# Patient Record
Sex: Female | Born: 1985 | Race: Black or African American | Hispanic: No | Marital: Single | Smoking: Former smoker
Health system: Southern US, Community
[De-identification: ages and names within clinical notes are randomized; demographics above are authoritative.]

## PROBLEM LIST (undated history)

## (undated) ENCOUNTER — Inpatient Hospital Stay (HOSPITAL_COMMUNITY): Payer: Self-pay

## (undated) DIAGNOSIS — D649 Anemia, unspecified: Secondary | ICD-10-CM

---

## 2018-03-31 ENCOUNTER — Encounter (HOSPITAL_COMMUNITY): Payer: Self-pay | Admitting: Emergency Medicine

## 2018-03-31 ENCOUNTER — Other Ambulatory Visit: Payer: Self-pay

## 2018-03-31 ENCOUNTER — Emergency Department (HOSPITAL_COMMUNITY): Payer: Self-pay

## 2018-03-31 ENCOUNTER — Emergency Department (HOSPITAL_COMMUNITY)
Admission: EM | Admit: 2018-03-31 | Discharge: 2018-04-01 | Disposition: A | Discharge status: Home / Self Care | Payer: Self-pay | Attending: Emergency Medicine | Admitting: Emergency Medicine

## 2018-03-31 DIAGNOSIS — O26899 Other specified pregnancy related conditions, unspecified trimester: Secondary | ICD-10-CM

## 2018-03-31 DIAGNOSIS — R112 Nausea with vomiting, unspecified: Secondary | ICD-10-CM

## 2018-03-31 DIAGNOSIS — Z87891 Personal history of nicotine dependence: Secondary | ICD-10-CM | POA: Insufficient documentation

## 2018-03-31 DIAGNOSIS — Z3A01 Less than 8 weeks gestation of pregnancy: Secondary | ICD-10-CM

## 2018-03-31 DIAGNOSIS — O9989 Other specified diseases and conditions complicating pregnancy, childbirth and the puerperium: Secondary | ICD-10-CM | POA: Insufficient documentation

## 2018-03-31 DIAGNOSIS — R197 Diarrhea, unspecified: Secondary | ICD-10-CM

## 2018-03-31 DIAGNOSIS — R102 Pelvic and perineal pain: Secondary | ICD-10-CM | POA: Insufficient documentation

## 2018-03-31 DIAGNOSIS — O23591 Infection of other part of genital tract in pregnancy, first trimester: Secondary | ICD-10-CM | POA: Insufficient documentation

## 2018-03-31 DIAGNOSIS — O210 Mild hyperemesis gravidarum: Secondary | ICD-10-CM | POA: Insufficient documentation

## 2018-03-31 LAB — COMPREHENSIVE METABOLIC PANEL
ALT: 13 U/L — ABNORMAL LOW (ref 14–54)
ANION GAP: 10 (ref 5–15)
AST: 18 U/L (ref 15–41)
Albumin: 3.8 g/dL (ref 3.5–5.0)
Alkaline Phosphatase: 72 U/L (ref 38–126)
BILIRUBIN TOTAL: 0.3 mg/dL (ref 0.3–1.2)
BUN: 9 mg/dL (ref 6–20)
CHLORIDE: 106 mmol/L (ref 101–111)
CO2: 24 mmol/L (ref 22–32)
Calcium: 8.7 mg/dL — ABNORMAL LOW (ref 8.9–10.3)
Creatinine, Ser: 0.59 mg/dL (ref 0.44–1.00)
Glucose, Bld: 111 mg/dL — ABNORMAL HIGH (ref 65–99)
POTASSIUM: 3.8 mmol/L (ref 3.5–5.1)
Sodium: 140 mmol/L (ref 135–145)
TOTAL PROTEIN: 7.3 g/dL (ref 6.5–8.1)

## 2018-03-31 LAB — LIPASE, BLOOD: LIPASE: 25 U/L (ref 11–51)

## 2018-03-31 LAB — CBC
HEMATOCRIT: 35.5 % — AB (ref 36.0–46.0)
HEMOGLOBIN: 11.4 g/dL — AB (ref 12.0–15.0)
MCH: 26.4 pg (ref 26.0–34.0)
MCHC: 32.1 g/dL (ref 30.0–36.0)
MCV: 82.2 fL (ref 78.0–100.0)
Platelets: 338 10*3/uL (ref 150–400)
RBC: 4.32 MIL/uL (ref 3.87–5.11)
RDW: 14.4 % (ref 11.5–15.5)
WBC: 9.3 10*3/uL (ref 4.0–10.5)

## 2018-03-31 LAB — I-STAT BETA HCG BLOOD, ED (MC, WL, AP ONLY): I-stat hCG, quantitative: >2000 m[IU]/mL — ABNORMAL HIGH (ref <5)

## 2018-03-31 NOTE — ED Notes (Signed)
Bed: WA06 Expected date:  Expected time:  Means of arrival:  Comments: 

## 2018-03-31 NOTE — ED Provider Notes (Signed)
[facility]-EMERGENCY DEPT Provider Note   CSN: [id number] Arrival date & time: 03/31/18  1914     History   Chief Complaint Chief Complaint  Patient presents with  . Abdominal Pain  . Nausea  . Emesis  . Diarrhea    HPI Denise Clay is a 32 y.o. female.  Patient presents with complaint of abdominal pain in RLQ that intermittently crosses the lower abdomen. She has had associated nausea, vomiting, and diarrhea and symptoms started about 4 days ago. No fever, CP, urinary symptoms, vaginal discharge or vaginal bleeding. No sick contacts. The vomiting occurs any time she tries to eat anything.   The history is provided by the patient. No language interpreter was used.    History reviewed. No pertinent past medical history.  There are no active problems to display for this patient.   History reviewed. No pertinent surgical history.   OB History   None      Home Medications    Prior to Admission medications   Not on File    Family History No family history on file.  Social History Social History   Tobacco Use  . Smoking status: Former Smoker    Last attempt to quit: 12/11/2017    Years since quitting: 0.3  . Smokeless tobacco: Never Used  Substance Use Topics  . Alcohol use: Not Currently    Frequency: Never  . Drug use: Never     Allergies   Morphine and related   Review of Systems Review of Systems  Constitutional: Negative for chills and fever.  Respiratory: Negative.  Negative for cough and shortness of breath.   Cardiovascular: Negative.  Negative for chest pain.  Gastrointestinal: Positive for abdominal pain, diarrhea, nausea and vomiting.  Genitourinary: Negative for dysuria, vaginal bleeding and vaginal discharge.  Musculoskeletal: Negative.  Negative for back pain.  Skin: Negative.   Neurological: Negative.      Physical Exam Updated Vital Signs BP 114/69 (BP Location: Left Arm)   Pulse 72   Temp 97.8 F  (36.6 C) (Oral)   Resp 18   LMP 02/24/2018 (Exact Date)   SpO2 100%   Physical Exam  Constitutional: She appears well-developed and well-nourished.  HENT:  Head: Normocephalic.  Neck: Normal range of motion. Neck supple.  Cardiovascular: Normal rate and regular rhythm.  Pulmonary/Chest: Effort normal and breath sounds normal.  Abdominal: Soft. There is tenderness in the right lower quadrant and suprapubic area. There is no rebound and no guarding.  Genitourinary: Uterus is not tender. Cervix exhibits no motion tenderness, no discharge and no friability. Right adnexum displays no tenderness. Left adnexum displays no tenderness. No tenderness or bleeding in the vagina. Vaginal discharge (White, consistent, smooth discharge) found.  Musculoskeletal: Normal range of motion.  Neurological: She is alert. No cranial nerve deficit.  Skin: Skin is warm and dry. No rash noted.  Psychiatric: She has a normal mood and affect.     ED Treatments / Results  Labs (all labs ordered are listed, but only abnormal results are displayed) Labs Reviewed  COMPREHENSIVE METABOLIC PANEL - Abnormal; Notable for the following components:      Result Value   Glucose, Bld 111 (*)    Calcium 8.7 (*)    ALT 13 (*)    All other components within normal limits  CBC - Abnormal; Notable for the following components:   Hemoglobin 11.4 (*)    HCT 35.5 (*)    All other components within normal limits  I-STAT BETA HCG BLOOD, ED ([facility], [facility], [facility] ONLY) - Abnormal; Notable for the following components:   I-stat hCG, quantitative >2,000.0 (*)    All other components within normal limits  WET PREP, GENITAL  LIPASE, BLOOD  URINALYSIS, ROUTINE W REFLEX MICROSCOPIC  HCG, QUANTITATIVE, PREGNANCY  HIV ANTIBODY (ROUTINE TESTING)  RAPID HIV SCREEN (HIV 1/2 AB+AG)  ABO/RH  GC/CHLAMYDIA PROBE AMP ([place]) NOT AT [facility]   Results for orders placed or performed during the hospital encounter of 03/31/18  Lipase, blood    Result Value Ref Range   Lipase 25 11 - 51 U/L  Comprehensive metabolic panel  Result Value Ref Range   Sodium 140 135 - 145 mmol/L   Potassium 3.8 3.5 - 5.1 mmol/L   Chloride 106 101 - 111 mmol/L   CO2 24 22 - 32 mmol/L   Glucose, Bld 111 (H) 65 - 99 mg/dL   BUN 9 6 - 20 mg/dL   Creatinine, Ser 1.61 0.44 - 1.00 mg/dL   Calcium 8.7 (L) 8.9 - 10.3 mg/dL   Total Protein 7.3 6.5 - 8.1 g/dL   Albumin 3.8 3.5 - 5.0 g/dL   AST 18 15 - 41 U/L   ALT 13 (L) 14 - 54 U/L   Alkaline Phosphatase 72 38 - 126 U/L   Total Bilirubin 0.3 0.3 - 1.2 mg/dL   GFR calc non Af Amer >60 >60 mL/min   GFR calc Af Amer >60 >60 mL/min   Anion gap 10 5 - 15  CBC  Result Value Ref Range   WBC 9.3 4.0 - 10.5 K/uL   RBC 4.32 3.87 - 5.11 MIL/uL   Hemoglobin 11.4 (L) 12.0 - 15.0 g/dL   HCT 09.6 (L) 04.5 - 40.9 %   MCV 82.2 78.0 - 100.0 fL   MCH 26.4 26.0 - 34.0 pg   MCHC 32.1 30.0 - 36.0 g/dL   RDW 81.1 91.4 - 78.2 %   Platelets 338 150 - 400 K/uL  I-Stat beta hCG blood, ED  Result Value Ref Range   I-stat hCG, quantitative >2,000.0 (H) <5 mIU/mL   Comment 3            EKG None  Radiology No results found.  Procedures Procedures (including critical care time)  Medications Ordered in ED Medications - No data to display   Initial Impression / Assessment and Plan / ED Course  I have reviewed the triage vital signs and the nursing notes.  Pertinent labs & imaging results that were available during my care of the patient were reviewed by me and considered in my medical decision making (see chart for details).     Patient with RLQ and suprapubic abdominal pain x 4 days associated with N, V, D.  Labs are stable. She is pregnant. Pelvic exam does not show significant or concerning tenderness, bleeding or discharge. US obtained which verifies single IUP without visualized embryo. Recommend follow up US in 10-14 days.   Findings discussed with the patient. She can be discharged home with OB/GYN  follow up. Flagyl provided for BV infection detected on wet prep.   Final Clinical Impressions(s) / ED Diagnoses   Final diagnoses:  Pelvic pain during pregnancy   2. . Pregnant 3. BV  ED Discharge Orders    None       Danne Harbor 04/01/18 0119    Benjiman Core, MD 04/03/18 [phone]

## 2018-03-31 NOTE — ED Triage Notes (Signed)
Pt states that she has been having NVD since Sunday.  RLQ pain.  Pt appears to be in no distress.

## 2018-03-31 NOTE — ED Notes (Signed)
Bed: WA08 Expected date:  Expected time:  Means of arrival:  Comments: 

## 2018-03-31 NOTE — ED Notes (Signed)
Ultrasound at bedside

## 2018-04-01 LAB — URINALYSIS, ROUTINE W REFLEX MICROSCOPIC
BILIRUBIN URINE: NEGATIVE
Bacteria, UA: NONE SEEN
GLUCOSE, UA: NEGATIVE mg/dL
Hgb urine dipstick: NEGATIVE
KETONES UR: NEGATIVE mg/dL
NITRITE: NEGATIVE
PH: 6 (ref 5.0–8.0)
Protein, ur: 30 mg/dL — AB
SPECIFIC GRAVITY, URINE: 1.033 — AB (ref 1.005–1.030)

## 2018-04-01 LAB — RAPID HIV SCREEN (HIV 1/2 AB+AG)
HIV 1/2 ANTIBODIES: NONREACTIVE
HIV-1 P24 ANTIGEN - HIV24: NONREACTIVE

## 2018-04-01 LAB — WET PREP, GENITAL
Sperm: NONE SEEN
TRICH WET PREP: NONE SEEN
WBC, Wet Prep HPF POC: NONE SEEN
Yeast Wet Prep HPF POC: NONE SEEN

## 2018-04-01 LAB — GC/CHLAMYDIA PROBE AMP (~~LOC~~) NOT AT ARMC
Chlamydia: NEGATIVE
Neisseria Gonorrhea: NEGATIVE

## 2018-04-01 LAB — HIV ANTIBODY (ROUTINE TESTING W REFLEX): HIV SCREEN 4TH GENERATION: NONREACTIVE

## 2018-04-01 LAB — ABO/RH: ABO/RH(D): B POS

## 2018-04-01 LAB — HCG, QUANTITATIVE, PREGNANCY: hCG, Beta Chain, Quant, S: 29164 m[IU]/mL — ABNORMAL HIGH (ref <5)

## 2018-04-01 MED ORDER — PRENATAL VITAMINS 0.8 MG PO TABS: 1.0000 | ORAL_TABLET | Freq: Every day | ORAL | 0 refills | Status: AC

## 2018-04-01 MED ORDER — METRONIDAZOLE 500 MG PO TABS: 500.0000 mg | ORAL_TABLET | Freq: Two times a day (BID) | ORAL | 0 refills | Status: DC

## 2018-04-01 MED ORDER — ONDANSETRON 4 MG PO TBDP: 4.0000 mg | ORAL_TABLET | Freq: Three times a day (TID) | ORAL | 0 refills | Status: DC | PRN

## 2018-04-01 NOTE — Discharge Instructions (Signed)
Your ultrasound will need to be repeated to confirm the baby's good health. Call to make an appointment with OB/GYN for further evaluation and prenatal care. If you have vaginal bleeding, worsening pelvic pain or new symptoms concerning the pregnancy go to [facility] MAU for urgent care. Return here as needed.

## 2018-04-12 ENCOUNTER — Inpatient Hospital Stay (HOSPITAL_COMMUNITY)
Admission: AD | Admit: 2018-04-12 | Discharge: 2018-04-13 | Disposition: A | Discharge status: Home / Self Care | Payer: Self-pay | Source: Ambulatory Visit | Attending: Obstetrics and Gynecology | Admitting: Obstetrics and Gynecology

## 2018-04-12 ENCOUNTER — Encounter (HOSPITAL_COMMUNITY): Payer: Self-pay | Admitting: *Deleted

## 2018-04-12 DIAGNOSIS — R109 Unspecified abdominal pain: Secondary | ICD-10-CM

## 2018-04-12 DIAGNOSIS — Z3A08 8 weeks gestation of pregnancy: Secondary | ICD-10-CM | POA: Insufficient documentation

## 2018-04-12 DIAGNOSIS — Z87891 Personal history of nicotine dependence: Secondary | ICD-10-CM | POA: Insufficient documentation

## 2018-04-12 DIAGNOSIS — Z3491 Encounter for supervision of normal pregnancy, unspecified, first trimester: Secondary | ICD-10-CM

## 2018-04-12 DIAGNOSIS — R1031 Right lower quadrant pain: Secondary | ICD-10-CM | POA: Insufficient documentation

## 2018-04-12 DIAGNOSIS — E860 Dehydration: Secondary | ICD-10-CM | POA: Insufficient documentation

## 2018-04-12 DIAGNOSIS — O219 Vomiting of pregnancy, unspecified: Secondary | ICD-10-CM | POA: Insufficient documentation

## 2018-04-12 DIAGNOSIS — Z885 Allergy status to narcotic agent status: Secondary | ICD-10-CM | POA: Insufficient documentation

## 2018-04-12 DIAGNOSIS — O99281 Endocrine, nutritional and metabolic diseases complicating pregnancy, first trimester: Secondary | ICD-10-CM | POA: Insufficient documentation

## 2018-04-12 DIAGNOSIS — O26891 Other specified pregnancy related conditions, first trimester: Secondary | ICD-10-CM | POA: Insufficient documentation

## 2018-04-12 DIAGNOSIS — O26899 Other specified pregnancy related conditions, unspecified trimester: Secondary | ICD-10-CM

## 2018-04-12 LAB — URINALYSIS, ROUTINE W REFLEX MICROSCOPIC
Bilirubin Urine: NEGATIVE
Glucose, UA: NEGATIVE mg/dL
Hgb urine dipstick: NEGATIVE
Ketones, ur: 80 mg/dL — AB
Leukocytes, UA: NEGATIVE
Nitrite: NEGATIVE
PROTEIN: 100 mg/dL — AB
SPECIFIC GRAVITY, URINE: 1.029 (ref 1.005–1.030)
pH: 7 (ref 5.0–8.0)

## 2018-04-12 MED ORDER — PROMETHAZINE HCL 25 MG/ML IJ SOLN
25.0000 mg | Freq: Once | INTRAMUSCULAR | Status: AC
  Administered 2018-04-13: 25 mg via INTRAVENOUS
  Filled 2018-04-12: qty 1

## 2018-04-12 MED ORDER — DEXTROSE 5 % IN LACTATED RINGERS IV BOLUS
1000.0000 mL | Freq: Once | INTRAVENOUS | Status: AC
  Administered 2018-04-12: 1000 mL via INTRAVENOUS

## 2018-04-12 NOTE — MAU Note (Addendum)
PT SAYS  SHE WENT  TO [facility]- 2 WEEKS AGO- FOR ABD PAIN AND NAUSEA-  POSITIVE UPT- TOLD  TO COME HERE.      VOMITING  X4 DAYS   PT DID NOT  DELIVERED OTHER BABIES  HERE.   HAS LOWER ABD  PAIN- STARTED SAT .

## 2018-04-12 NOTE — MAU Provider Note (Signed)
Chief Complaint: Emesis   First Provider Initiated Contact with Patient 04/12/18 2301      SUBJECTIVE HPI: Denise Clay is a 32 y.o. G3P2002 at [redacted]w[redacted]d by LMP who presents to maternity admissions reporting RLQ pain x 2 weeks and nausea with vomiting. She reports she vomits 10+ times daily.  She was diagnosed with early IUP on 5/22 at [facility] and prescribed Zofran for nausea. She reports it does not help. She last took it this morning. Her pain is low on her right side, sharp, and constant pain.  There are no other associated symptoms. She has not tried any other treatments.     HPI  Past Medical History:  Diagnosis Date  . Medical history non-contributory    Past Surgical History:  Procedure Laterality Date  . CESAREAN SECTION     Social History   Socioeconomic History  . Marital status: Single    Spouse name: Not on file  . Number of children: Not on file  . Years of education: Not on file  . Highest education level: Not on file  Occupational History  . Not on file  Social Needs  . Financial resource strain: Not on file  . Food insecurity:    Worry: Not on file    Inability: Not on file  . Transportation needs:    Medical: Not on file    Non-medical: Not on file  Tobacco Use  . Smoking status: Former Smoker    Last attempt to quit: 12/11/2017    Years since quitting: 0.3  . Smokeless tobacco: Never Used  Substance and Sexual Activity  . Alcohol use: Not Currently    Frequency: Never  . Drug use: Never  . Sexual activity: Yes  Lifestyle  . Physical activity:    Days per week: Not on file    Minutes per session: Not on file  . Stress: Not on file  Relationships  . Social connections:    Talks on phone: Not on file    Gets together: Not on file    Attends religious service: Not on file    Active member of club or organization: Not on file    Attends meetings of clubs or organizations: Not on file    Relationship status: Not on file  . Intimate partner violence:   Fear of current or ex partner: Not on file    Emotionally abused: Not on file    Physically abused: Not on file    Forced sexual activity: Not on file  Other Topics Concern  . Not on file  Social History Narrative  . Not on file   No current facility-administered medications on file prior to encounter.    Current Outpatient Medications on File Prior to Encounter  Medication Sig Dispense Refill  . ondansetron (ZOFRAN ODT) 4 MG disintegrating tablet Take 1 tablet (4 mg total) by mouth every 8 (eight) hours as needed for nausea or vomiting. 20 tablet 0  . Prenatal Multivit-Min-Fe-FA (PRENATAL VITAMINS) 0.8 MG tablet Take 1 tablet by mouth daily. 60 tablet 0   Allergies  Allergen Reactions  . Morphine And Related Itching and Swelling    ROS:  Review of Systems  Constitutional: Negative for chills, fatigue and fever.  Respiratory: Negative for shortness of breath.   Cardiovascular: Negative for chest pain.  Genitourinary: Positive for pelvic pain. Negative for difficulty urinating, dysuria, flank pain, vaginal bleeding, vaginal discharge and vaginal pain.  Neurological: Negative for dizziness and headaches.  Psychiatric/Behavioral: Negative.  I have reviewed patient's Past Medical Hx, Surgical Hx, Family Hx, Social Hx, medications and allergies.   Physical Exam   Patient Vitals for the past 24 hrs:  BP Temp Temp src Pulse Resp Height Weight  04/13/18 0159 122/70 98.3 F (36.8 C) Oral 74 18 - -  04/12/18 2220 117/60 98.6 F (37 C) Oral 81 20 5\' 4"  (1.626 m) 277 lb 4 oz (125.8 kg)   Constitutional: Well-developed, well-nourished female in no acute distress.  Cardiovascular: normal rate Respiratory: normal effort GI: Abd soft, non-tender. No rebound tenderness or guarding. Pos BS x 4 MS: Extremities nontender, no edema, normal ROM Neurologic: Alert and oriented x 4.  GU: Neg CVAT.  PELVIC EXAM: Cervix pink, visually closed, without lesion, scant white creamy discharge,  vaginal walls and external genitalia normal Bimanual exam: Cervix 0/long/high, firm, anterior, neg CMT, uterus mildly tender, nonenlarged, adnexa with mild tenderness on the right, no enlargement or mass bilaterally    LAB RESULTS Results for orders placed or performed during the hospital encounter of 04/12/18 (from the past 24 hour(s))  Urinalysis, Routine w reflex microscopic     Status: Abnormal   Collection Time: 04/12/18 10:22 PM  Result Value Ref Range   Color, Urine YELLOW YELLOW   APPearance HAZY (A) CLEAR   Specific Gravity, Urine 1.029 1.005 - 1.030   pH 7.0 5.0 - 8.0   Glucose, UA NEGATIVE NEGATIVE mg/dL   Hgb urine dipstick NEGATIVE NEGATIVE   Bilirubin Urine NEGATIVE NEGATIVE   Ketones, ur 80 (A) NEGATIVE mg/dL   Protein, ur 161 (A) NEGATIVE mg/dL   Nitrite NEGATIVE NEGATIVE   Leukocytes, UA NEGATIVE NEGATIVE   RBC / HPF 6-10 0 - 5 RBC/hpf   WBC, UA 0-5 0 - 5 WBC/hpf   Bacteria, UA RARE (A) NONE SEEN   Squamous Epithelial / LPF 11-20 0 - 5   Mucus PRESENT   CBC     Status: Abnormal   Collection Time: 04/12/18 11:55 PM  Result Value Ref Range   WBC 11.0 (H) 4.0 - 10.5 K/uL   RBC 4.23 3.87 - 5.11 MIL/uL   Hemoglobin 11.3 (L) 12.0 - 15.0 g/dL   HCT 09.6 (L) 04.5 - 40.9 %   MCV 83.5 78.0 - 100.0 fL   MCH 26.7 26.0 - 34.0 pg   MCHC 32.0 30.0 - 36.0 g/dL   RDW 81.1 91.4 - 78.2 %   Platelets 331 150 - 400 K/uL  Comprehensive metabolic panel     Status: Abnormal   Collection Time: 04/12/18 11:55 PM  Result Value Ref Range   Sodium 133 (L) 135 - 145 mmol/L   Potassium 4.1 3.5 - 5.1 mmol/L   Chloride 104 101 - 111 mmol/L   CO2 17 (L) 22 - 32 mmol/L   Glucose, Bld 92 65 - 99 mg/dL   BUN 7 6 - 20 mg/dL   Creatinine, Ser 9.56 0.44 - 1.00 mg/dL   Calcium 8.7 (L) 8.9 - 10.3 mg/dL   Total Protein 6.9 6.5 - 8.1 g/dL   Albumin 3.6 3.5 - 5.0 g/dL   AST 14 (L) 15 - 41 U/L   ALT 10 (L) 14 - 54 U/L   Alkaline Phosphatase 69 38 - 126 U/L   Total Bilirubin 0.8 0.3 - 1.2  mg/dL   GFR calc non Af Amer >60 >60 mL/min   GFR calc Af Amer >60 >60 mL/min   Anion gap 12 5 - 15    --/--/B POS (05/23 0014)  IMAGING [place] Ob Transvaginal  Result Date: 04/13/2018 CLINICAL DATA:  Initial evaluation for acute lower abdominal pain, early pregnancy. EXAM: OBSTETRIC <14 WK US AND TRANSVAGINAL OB US TECHNIQUE: Both transabdominal and transvaginal ultrasound examinations were performed for complete evaluation of the gestation as well as the maternal uterus, adnexal regions, and pelvic cul-de-sac. Transvaginal technique was performed to assess early pregnancy. COMPARISON:  None. FINDINGS: Intrauterine gestational sac: Single Yolk sac:  Present Embryo:  Present Cardiac Activity: Present Heart Rate: 154 bpm CRL: 16.7 mm   80 w   0 d                  US EDC: 11/23/2018 Subchorionic hemorrhage:  None visualized. Maternal uterus/adnexae: Ovaries are normal in appearance bilaterally. Small corpus luteal cyst noted on the left. No free fluid. IMPRESSION: 1. Single live intrauterine pregnancy as above without complication, estimated gestational age [redacted] weeks and 0 days by crown-rump length. 2. No other acute maternal uterine or adnexal abnormality identified. Electronically Signed   By: Rise MuBenjamin  McClintock M.D.   On: 04/13/2018 00:40   [place] Ob Less Than 14 Weeks W/ Ob Transvaginal And Doppler  Result Date: 04/01/2018 CLINICAL DATA:  Pelvic pain with vomiting, i-STAT HCG greater than 2000 EXAM: OBSTETRIC <14 WK US AND TRANSVAGINAL OB US DOPPLER ULTRASOUND OF OVARIES TECHNIQUE: Both transabdominal and transvaginal ultrasound examinations were performed for complete evaluation of the gestation as well as the maternal uterus, adnexal regions, and pelvic cul-de-sac. Transvaginal technique was performed to assess early pregnancy. Color and duplex Doppler ultrasound was utilized to evaluate blood flow to the ovaries. COMPARISON:  None. FINDINGS: Intrauterine gestational sac: Single intrauterine gestational  sac. Yolk sac:  Visible Embryo:  Not visible MSD: 13.3 mm   6 w   1 d Subchorionic hemorrhage:  None visualized. Maternal uterus/adnexae: Left ovary is within normal limits and measures 2.7 by 3.5 x 2.1 cm. Cyst measuring 1.9 cm. The right ovary measures 3.3 x 2.2 by 2 cm. Echogenic lesion in the right ovary measuring 1 cm, possible hemorrhagic follicle. Trace free fluid. Pulsed Doppler evaluation of both ovaries demonstrates normal appearing low-resistance arterial and venous waveforms. IMPRESSION: 1. Negative for ovarian torsion 2. Single intrauterine pregnancy with visible gestational sac and yolk sac but no embryo. Consider follow-up ultrasound in 10-14 days to confirm viability. 3. Trace free fluid in the pelvis Electronically Signed   By: Jasmine PangKim  Fujinaga M.D.   On: 04/01/2018 00:40    MAU Management/MDM: CBC, CMP, UA, OB US CBC and CMP without significant abnormalities UA with 80 ketones, and high specific gravity so evidence of dehydration IV fluids, Phenergan 25 mg IV given with significant improvement  Rx for Phenergan, pt to take PRN with Zofran sparingly for breakthrough symptoms F/U with Ob/gyn provider, list provided Return to MAU as needed for emergencies  Pt discharged with strict return precautions.  ASSESSMENT 1. Nausea and vomiting during pregnancy prior to [redacted] weeks gestation   2. Abdominal pain during pregnancy in first trimester   3. Mild dehydration   4. Abdominal pain affecting pregnancy   5. Normal IUP (intrauterine pregnancy) on prenatal ultrasound, first trimester     PLAN Discharge home Allergies as of 04/13/2018      Reactions   Morphine And Related Itching, Swelling      Medication List    STOP taking these medications   metroNIDAZOLE 500 MG tablet Commonly known as:  FLAGYL     TAKE these medications   ondansetron 4 MG  disintegrating tablet Commonly known as:  ZOFRAN ODT Take 1 tablet (4 mg total) by mouth every 8 (eight) hours as needed for nausea or  vomiting.   Prenatal Vitamins 0.8 MG tablet Take 1 tablet by mouth daily.   promethazine 25 MG tablet Commonly known as:  PHENERGAN Take 0.5-1 tablets (12.5-25 mg total) by mouth every 6 (six) hours as needed for nausea.      Follow-up Information    Prenatal provider of your choice Follow up.   Why:  See list provided          Sharen Counter Certified Nurse-Midwife 04/13/2018  4:56 AM

## 2018-04-13 ENCOUNTER — Inpatient Hospital Stay (HOSPITAL_COMMUNITY): Payer: Self-pay

## 2018-04-13 DIAGNOSIS — R109 Unspecified abdominal pain: Secondary | ICD-10-CM

## 2018-04-13 DIAGNOSIS — O26891 Other specified pregnancy related conditions, first trimester: Secondary | ICD-10-CM

## 2018-04-13 DIAGNOSIS — O219 Vomiting of pregnancy, unspecified: Secondary | ICD-10-CM

## 2018-04-13 LAB — CBC
HEMATOCRIT: 35.3 % — AB (ref 36.0–46.0)
Hemoglobin: 11.3 g/dL — ABNORMAL LOW (ref 12.0–15.0)
MCH: 26.7 pg (ref 26.0–34.0)
MCHC: 32 g/dL (ref 30.0–36.0)
MCV: 83.5 fL (ref 78.0–100.0)
PLATELETS: 331 10*3/uL (ref 150–400)
RBC: 4.23 MIL/uL (ref 3.87–5.11)
RDW: 14.4 % (ref 11.5–15.5)
WBC: 11 10*3/uL — AB (ref 4.0–10.5)

## 2018-04-13 LAB — COMPREHENSIVE METABOLIC PANEL
ALBUMIN: 3.6 g/dL (ref 3.5–5.0)
ALT: 10 U/L — ABNORMAL LOW (ref 14–54)
ANION GAP: 12 (ref 5–15)
AST: 14 U/L — AB (ref 15–41)
Alkaline Phosphatase: 69 U/L (ref 38–126)
BILIRUBIN TOTAL: 0.8 mg/dL (ref 0.3–1.2)
BUN: 7 mg/dL (ref 6–20)
CHLORIDE: 104 mmol/L (ref 101–111)
CO2: 17 mmol/L — AB (ref 22–32)
Calcium: 8.7 mg/dL — ABNORMAL LOW (ref 8.9–10.3)
Creatinine, Ser: 0.53 mg/dL (ref 0.44–1.00)
GFR calc Af Amer: >60 mL/min (ref >60)
GFR calc non Af Amer: >60 mL/min (ref >60)
GLUCOSE: 92 mg/dL (ref 65–99)
POTASSIUM: 4.1 mmol/L (ref 3.5–5.1)
SODIUM: 133 mmol/L — AB (ref 135–145)
Total Protein: 6.9 g/dL (ref 6.5–8.1)

## 2018-04-13 MED ORDER — PROMETHAZINE HCL 25 MG PO TABS: 12.5000 mg | ORAL_TABLET | Freq: Four times a day (QID) | ORAL | 2 refills | Status: DC | PRN

## 2018-04-13 NOTE — Discharge Instructions (Signed)
[place] [facility]  ° ° °[facility]       Phone: [phone] ° °[facility] [place] Phone: [phone] ° °[facility] [place]  Phone: [phone] ° °[facility] [place]  Phone: [phone] ° °[facility] [place]  Phone: [phone] ° °[facility]       Phone: [phone] ° °[facility]    Phone: [phone]  ° °[facility] ([place])    Phone: [phone] ° °[facility]    Phone: [phone] ° °[facility]    Phone: [phone] ° °[facility]    Phone: [phone] ° °[facility]       Phone: [phone]  ° °[facility]  Phone: [phone] ° °[facility]    Phone: [phone] ° °[facility]    Phone: [phone] ° °[facility]      Phone: [phone] ° °[facility]    Phone: [phone] ° °

## 2018-05-05 ENCOUNTER — Inpatient Hospital Stay (HOSPITAL_COMMUNITY)
Admission: AD | Admit: 2018-05-05 | Discharge: 2018-05-05 | Disposition: A | Discharge status: Home / Self Care | Payer: Medicaid Other | Source: Ambulatory Visit | Attending: Obstetrics & Gynecology | Admitting: Obstetrics & Gynecology

## 2018-05-05 ENCOUNTER — Encounter (HOSPITAL_COMMUNITY): Payer: Self-pay | Admitting: *Deleted

## 2018-05-05 DIAGNOSIS — Z87891 Personal history of nicotine dependence: Secondary | ICD-10-CM | POA: Insufficient documentation

## 2018-05-05 DIAGNOSIS — Z3A11 11 weeks gestation of pregnancy: Secondary | ICD-10-CM | POA: Insufficient documentation

## 2018-05-05 DIAGNOSIS — Z885 Allergy status to narcotic agent status: Secondary | ICD-10-CM | POA: Insufficient documentation

## 2018-05-05 DIAGNOSIS — O209 Hemorrhage in early pregnancy, unspecified: Secondary | ICD-10-CM

## 2018-05-05 DIAGNOSIS — O210 Mild hyperemesis gravidarum: Secondary | ICD-10-CM | POA: Insufficient documentation

## 2018-05-05 DIAGNOSIS — O219 Vomiting of pregnancy, unspecified: Secondary | ICD-10-CM

## 2018-05-05 LAB — URINALYSIS, ROUTINE W REFLEX MICROSCOPIC
BILIRUBIN URINE: NEGATIVE
Glucose, UA: NEGATIVE mg/dL
Hgb urine dipstick: NEGATIVE
KETONES UR: 80 mg/dL — AB
LEUKOCYTES UA: NEGATIVE
Nitrite: NEGATIVE
Protein, ur: 100 mg/dL — AB
Specific Gravity, Urine: 1.028 (ref 1.005–1.030)
pH: 6 (ref 5.0–8.0)

## 2018-05-05 MED ORDER — ONDANSETRON 4 MG PO TBDP: 4.0000 mg | ORAL_TABLET | Freq: Four times a day (QID) | ORAL | 0 refills | Status: DC | PRN

## 2018-05-05 NOTE — Discharge Instructions (Signed)
Morning Sickness Morning sickness is when you feel sick to your stomach (nauseous) during pregnancy. This nauseous feeling may or may not come with vomiting. It often occurs in the morning but can be a problem any time of day. Morning sickness is most common during the first trimester, but it may continue throughout pregnancy. While morning sickness is unpleasant, it is usually harmless unless you develop severe and continual vomiting (hyperemesis gravidarum). This condition requires more intense treatment. What are the causes? The cause of morning sickness is not completely known but seems to be related to normal hormonal changes that occur in pregnancy. What increases the risk? You are at greater risk if you:  Experienced nausea or vomiting before your pregnancy.  Had morning sickness during a previous pregnancy.  Are pregnant with more than one baby, such as twins.  How is this treated? Do not use any medicines (prescription, over-the-counter, or herbal) for morning sickness without first talking to your health care provider. Your health care provider may prescribe or recommend:  Vitamin B6 supplements.  Anti-nausea medicines.  The herbal medicine ginger.  Follow these instructions at home:  Only take over-the-counter or prescription medicines as directed by your health care provider.  Taking multivitamins before getting pregnant can prevent or decrease the severity of morning sickness in most women.  Eat a piece of dry toast or unsalted crackers before getting out of bed in the morning.  Eat five or six small meals a day.  Eat dry and bland foods (rice, baked potato). Foods high in carbohydrates are often helpful.  Do not drink liquids with your meals. Drink liquids between meals.  Avoid greasy, fatty, and spicy foods.  Get someone to cook for you if the smell of any food causes nausea and vomiting.  If you feel nauseous after taking prenatal vitamins, take the vitamins at  night or with a snack.  Snack on protein foods (nuts, yogurt, cheese) between meals if you are hungry.  Eat unsweetened gelatins for desserts.  Wearing an acupressure wristband (worn for sea sickness) may be helpful.  Acupuncture may be helpful.  Do not smoke.  Get a humidifier to keep the air in your house free of odors.  Get plenty of fresh air. Contact a health care provider if:  Your home remedies are not working, and you need medicine.  You feel dizzy or lightheaded.  You are losing weight. Get help right away if:  You have persistent and uncontrolled nausea and vomiting.  You pass out (faint). This information is not intended to replace advice given to you by your health care provider. Make sure you discuss any questions you have with your health care provider. Document Released: 12/18/2006 Document Revised: 04/03/2016 Document Reviewed: 04/13/2013 Elsevier Interactive Patient Education  2017 Elsevier Inc.  Vaginal Bleeding During Pregnancy, First Trimester A small amount of bleeding (spotting) from the vagina is common in early pregnancy. Sometimes the bleeding is normal and is not a problem, and sometimes it is a sign of something serious. Be sure to tell your doctor about any bleeding from your vagina right away. Follow these instructions at home:  Watch your condition for any changes.  Follow your doctor's instructions about how active you can be.  If you are on bed rest: ? You may need to stay in bed and only get up to use the bathroom. ? You may be allowed to do some activities. ? If you need help, make plans for someone to help you.  Write down: ? The number of pads you use each day. ? How often you change pads. ? How soaked (saturated) your pads are.  Do not use tampons.  Do not douche.  Do not have sex or orgasms until your doctor says it is okay.  If you pass any tissue from your vagina, save the tissue so you can show it to your doctor.  Only  take medicines as told by your doctor.  Do not take aspirin because it can make you bleed.  Keep all follow-up visits as told by your doctor. Contact a doctor if:  You bleed from your vagina.  You have cramps.  You have labor pains.  You have a fever that does not go away after you take medicine. Get help right away if:  You have very bad cramps in your back or belly (abdomen).  You pass large clots or tissue from your vagina.  You bleed more.  You feel light-headed or weak.  You pass out (faint).  You have chills.  You are leaking fluid or have a gush of fluid from your vagina.  You pass out while pooping (having a bowel movement). This information is not intended to replace advice given to you by your health care provider. Make sure you discuss any questions you have with your health care provider. Document Released: 03/13/2014 Document Revised: 04/03/2016 Document Reviewed: 07/04/2013 Elsevier Interactive Patient Education  2018 Elsevier Inc. [place] [facility] Providers    [facility] [employer] [facility]       Phone: [phone]  [facility] [employer] [facility] [place] Phone: [phone]  [facility] [employer] [facility] [place]  Phone: [phone]  [facility]  Phone: [phone]  [facility] [place]  Phone: [phone]  [facility]       Phone: [phone]  [facility]    Phone: [phone]   [facility] [place])    Phone: [phone]  [facility]    Phone: [phone]  [facility]    Phone: [phone]  [facility]    Phone: [phone]  [facility]       Phone: [phone]   [facility]  Phone: [phone]  [facility]    Phone:  [phone]  [facility]   Phone: [phone]  [facility]      Phone: [phone]  [facility]    Phone: [phone]

## 2018-05-05 NOTE — MAU Note (Signed)
Pt presents to MAU with complaints of vaginal bleeding when she wipes since last night with abdominal cramping. Morning sickness continues and states she is taking phenergan for the nausea but states it does not help anymore. Last intercourse Saturday

## 2018-05-05 NOTE — MAU Note (Signed)
Pt reports reports bleeding since last night. Bleeding was bright red at first, but turned to pink/dark brow. No active bleeding @ this time. Pt reports cramping and shooting pain since yesterday. Pain was worse this morning 9/10. Currently pain is on the R side and pt rates it 6/10. Denies discharge or dysuria.   Pt also reports nausea/vomiting/spitting. Pt took phenergan Monday, but came back up. Has not taken it since.

## 2018-05-05 NOTE — MAU Provider Note (Signed)
Chief Complaint: Vaginal Bleeding; Morning Sickness; and Abdominal Pain   First Provider Initiated Contact with Patient 05/05/18 2037        SUBJECTIVE HPI: Denise Clay is a 32 y.o. G3P2002 at [redacted]w[redacted]d by LMP who presents to maternity admissions reporting bleeding for the past couple of days.  States was bright red and halfway soaked a pad "like a period".  Less now.  ALso has cramping.  Nausea only partially relieved by Phenergan.. She denies vaginal itching/burning, urinary symptoms, h/a, dizziness, or fever/chills.    Vaginal Bleeding  The patient's primary symptoms include pelvic pain and vaginal bleeding. The patient's pertinent negatives include no genital itching, genital lesions or genital odor. This is a recurrent problem. The current episode started in the past 7 days. The problem occurs intermittently. The problem has been gradually improving. She is pregnant. Associated symptoms include abdominal pain, nausea and vomiting. Pertinent negatives include no chills, constipation, diarrhea or fever. The vaginal discharge was bloody. The vaginal bleeding is typical of menses. She has not been passing clots. She has not been passing tissue. Nothing aggravates the symptoms. She has tried nothing for the symptoms.  Abdominal Pain  This is a recurrent problem. The current episode started in the past 7 days. The onset quality is gradual. The problem occurs intermittently. The problem has been waxing and waning. The pain is located in the suprapubic region, LLQ and RLQ. The quality of the pain is cramping. The abdominal pain does not radiate. Associated symptoms include nausea and vomiting. Pertinent negatives include no constipation, diarrhea or fever. The pain is relieved by nothing. She has tried nothing for the symptoms.    Past Medical History:  Diagnosis Date  . Medical history non-contributory    Past Surgical History:  Procedure Laterality Date  . CESAREAN SECTION     Social History    Socioeconomic History  . Marital status: Single    Spouse name: Not on file  . Number of children: Not on file  . Years of education: Not on file  . Highest education level: Not on file  Occupational History  . Not on file  Social Needs  . Financial resource strain: Not on file  . Food insecurity:    Worry: Not on file    Inability: Not on file  . Transportation needs:    Medical: Not on file    Non-medical: Not on file  Tobacco Use  . Smoking status: Former Smoker    Last attempt to quit: 12/11/2017    Years since quitting: 0.3  . Smokeless tobacco: Never Used  Substance and Sexual Activity  . Alcohol use: Not Currently    Frequency: Never  . Drug use: Never  . Sexual activity: Yes  Lifestyle  . Physical activity:    Days per week: Not on file    Minutes per session: Not on file  . Stress: Not on file  Relationships  . Social connections:    Talks on phone: Not on file    Gets together: Not on file    Attends religious service: Not on file    Active member of club or organization: Not on file    Attends meetings of clubs or organizations: Not on file    Relationship status: Not on file  . Intimate partner violence:    Fear of current or ex partner: Not on file    Emotionally abused: Not on file    Physically abused: Not on file    Forced  sexual activity: Not on file  Other Topics Concern  . Not on file  Social History Narrative  . Not on file   No current facility-administered medications on file prior to encounter.    Current Outpatient Medications on File Prior to Encounter  Medication Sig Dispense Refill  . ondansetron (ZOFRAN ODT) 4 MG disintegrating tablet Take 1 tablet (4 mg total) by mouth every 8 (eight) hours as needed for nausea or vomiting. 20 tablet 0  . Prenatal Multivit-Min-Fe-FA (PRENATAL VITAMINS) 0.8 MG tablet Take 1 tablet by mouth daily. 60 tablet 0  . promethazine (PHENERGAN) 25 MG tablet Take 0.5-1 tablets (12.5-25 mg total) by mouth  every 6 (six) hours as needed for nausea. 30 tablet 2   Allergies  Allergen Reactions  . Morphine And Related Itching and Swelling    I have reviewed patient's Past Medical Hx, Surgical Hx, Family Hx, Social Hx, medications and allergies.   ROS:  Review of Systems  Constitutional: Negative for chills and fever.  Gastrointestinal: Positive for abdominal pain, nausea and vomiting. Negative for constipation and diarrhea.  Genitourinary: Positive for pelvic pain and vaginal bleeding.   Review of Systems  Other systems negative   Physical Exam  Physical Exam Patient Vitals for the past 24 hrs:  BP Temp Pulse Resp Height Weight  05/05/18 1842 131/77 98.2 F (36.8 C) 85 16 5\' 4"  (1.626 m) 267 lb (121.1 kg)   Constitutional: Well-developed, well-nourished female in no acute distress.  Cardiovascular: normal rate Respiratory: normal effort GI: Abd soft, non-tender. Pos BS x 4 MS: Extremities nontender, no edema, normal ROM Neurologic: Alert and oriented x 4.  GU: Neg CVAT.  PELVIC EXAM: Cervix pink, visually closed, without lesion, scant pink creamy discharge, vaginal walls and external genitalia normal  FHT 160 by bedside US.  Fetus appears grossly normal, steady HR  LAB RESULTS Results for orders placed or performed during the hospital encounter of 05/05/18 (from the past 24 hour(s))  Urinalysis, Routine w reflex microscopic     Status: Abnormal   Collection Time: 05/05/18  6:54 PM  Result Value Ref Range   Color, Urine AMBER (A) YELLOW   APPearance HAZY (A) CLEAR   Specific Gravity, Urine 1.028 1.005 - 1.030   pH 6.0 5.0 - 8.0   Glucose, UA NEGATIVE NEGATIVE mg/dL   Hgb urine dipstick NEGATIVE NEGATIVE   Bilirubin Urine NEGATIVE NEGATIVE   Ketones, ur 80 (A) NEGATIVE mg/dL   Protein, ur 161100 (A) NEGATIVE mg/dL   Nitrite NEGATIVE NEGATIVE   Leukocytes, UA NEGATIVE NEGATIVE   RBC / HPF 0-5 0 - 5 RBC/hpf   WBC, UA 6-10 0 - 5 WBC/hpf   Bacteria, UA RARE (A) NONE SEEN    Squamous Epithelial / LPF 0-5 0 - 5   Mucus PRESENT     --/--/B POS (05/23 0014)  IMAGING   MAU Management/MDM: Reassured patient baby is doing well Likely the remainder of prior [place] hemorrhage Will try adding some Zofran for judicious use. Warned of small chance of birth defects, use only if needed PO hydration for cramps, Tylenol prn  ASSESSMENT Single IUP at [id number] Bleeding in first trimester Nausea and vomiting  PLAN Discharge home Rx Zofan for PRN use for nausea Bleeding precautions Start prenatal care Pt stable at time of discharge. Encouraged to return here or to other Urgent Care/ED if she develops worsening of symptoms, increase in pain, fever, or other concerning symptoms.    Wynelle BourgeoisMarie Natsumi Whitsitt CNM, MSN Certified Nurse-Midwife 05/05/2018  8:37 PM

## 2018-08-10 ENCOUNTER — Encounter: Payer: Self-pay | Admitting: *Deleted

## 2018-08-10 ENCOUNTER — Ambulatory Visit (INDEPENDENT_AMBULATORY_CARE_PROVIDER_SITE_OTHER): Payer: Self-pay | Admitting: Advanced Practice Midwife

## 2018-08-10 ENCOUNTER — Encounter: Payer: Self-pay | Admitting: Advanced Practice Midwife

## 2018-08-10 VITALS — BP 120/64 | HR 95 | Wt 271.7 lb

## 2018-08-10 DIAGNOSIS — Z3403 Encounter for supervision of normal first pregnancy, third trimester: Secondary | ICD-10-CM

## 2018-08-10 DIAGNOSIS — O26899 Other specified pregnancy related conditions, unspecified trimester: Secondary | ICD-10-CM

## 2018-08-10 DIAGNOSIS — Z3490 Encounter for supervision of normal pregnancy, unspecified, unspecified trimester: Secondary | ICD-10-CM | POA: Insufficient documentation

## 2018-08-10 DIAGNOSIS — Z3482 Encounter for supervision of other normal pregnancy, second trimester: Secondary | ICD-10-CM | POA: Diagnosis not present

## 2018-08-10 DIAGNOSIS — R102 Pelvic and perineal pain: Secondary | ICD-10-CM

## 2018-08-10 DIAGNOSIS — K117 Disturbances of salivary secretion: Secondary | ICD-10-CM

## 2018-08-10 LAB — POCT URINALYSIS DIP (DEVICE)
GLUCOSE, UA: NEGATIVE mg/dL
HGB URINE DIPSTICK: NEGATIVE
Ketones, ur: NEGATIVE mg/dL
LEUKOCYTES UA: NEGATIVE
NITRITE: NEGATIVE
Protein, ur: 30 mg/dL — AB
Specific Gravity, Urine: 1.02 (ref 1.005–1.030)
UROBILINOGEN UA: 1 mg/dL (ref 0.0–1.0)
pH: 7.5 (ref 5.0–8.0)

## 2018-08-10 MED ORDER — ONDANSETRON HCL 4 MG PO TABS: 4.0000 mg | ORAL_TABLET | Freq: Three times a day (TID) | ORAL | 1 refills | Status: DC | PRN

## 2018-08-10 MED ORDER — COMFORT FIT MATERNITY SUPP MED MISC: 1.0000 | Freq: Every day | 0 refills | Status: DC

## 2018-08-10 MED ORDER — GLYCOPYRROLATE 2 MG PO TABS: 2.0000 mg | ORAL_TABLET | Freq: Three times a day (TID) | ORAL | 3 refills | Status: DC | PRN

## 2018-08-10 NOTE — Patient Instructions (Addendum)
 PREGNANCY SUPPORT BELT: You are not alone, Seventy-five percent of women have some sort of abdominal or back pain at some point in their pregnancy. Your baby is growing at a fast pace, which means that your whole body is rapidly trying to adjust to the changes. As your uterus grows, your back may start feeling a bit under stress and this can result in back or abdominal pain that can go from mild, and therefore bearable, to severe pains that will not allow you to sit or lay down comfortably, When it comes to dealing with pregnancy-related pains and cramps, some pregnant women usually prefer natural remedies, which the market is filled with nowadays. For example, wearing a pregnancy support belt can help ease and lessen your discomfort and pain. WHAT ARE THE BENEFITS OF WEARING A PREGNANCY SUPPORT BELT? A pregnancy support belt provides support to the lower portion of the belly taking some of the weight of the growing uterus and distributing to the other parts of your body. It is designed make you comfortable and gives you extra support. Over the years, the pregnancy apparel market has been studying the needs and wants of pregnant women and they have come up with the most comfortable pregnancy support belts that woman could ever ask for. In fact, you will no longer have to wear a stretched-out or bulky pregnancy belt that is visible underneath your clothes and makes you feel even more uncomfortable. Nowadays, a pregnancy support belt is made of comfortable and stretchy materials that will not irritate your skin but will actually make you feel at ease and you will not even notice you are wearing it. They are easy to put on and adjust during the day and can be worn at night for additional support.  BENEFITS: . Relives Back pain . Relieves Abdominal Muscle and Leg Pain . Stabilizes the Pelvic Ring . Offers a Cushioned Abdominal Lift Pad . Relieves pressure on the Sciatic Nerve Within Minutes WHERE TO GET  YOUR PREGNANCY BELT: [employer] [phone] @[address] [place], [place] [address]     Third Trimester of Pregnancy The third trimester is from week 28 through week 40 (months 7 through 9). The third trimester is a time when the unborn baby (fetus) is growing rapidly. At the end of the ninth month, the fetus is about 20 inches in length and weighs 6-10 pounds. Body changes during your third trimester Your body will continue to go through many changes during pregnancy. The changes vary from woman to woman. During the third trimester:  Your weight will continue to increase. You can expect to gain 25-35 pounds (11-16 kg) by the end of the pregnancy.  You may begin to get stretch marks on your hips, abdomen, and breasts.  You may urinate more often because the fetus is moving lower into your pelvis and pressing on your bladder.  You may develop or continue to have heartburn. This is caused by increased hormones that slow down muscles in the digestive tract.  You may develop or continue to have constipation because increased hormones slow digestion and cause the muscles that push waste through your intestines to relax.  You may develop hemorrhoids. These are swollen veins (varicose veins) in the rectum that can itch or be painful.  You may develop swollen, bulging veins (varicose veins) in your legs.  You may have increased body aches in the pelvis, back, or thighs. This is due to weight gain and increased hormones that   are relaxing your joints.  You may have changes in your hair. These can include thickening of your hair, rapid growth, and changes in texture. Some women also have hair loss during or after pregnancy, or hair that feels dry or thin. Your hair will most likely return to normal after your baby is born.  Your breasts will continue to grow and they will continue to become tender. A yellow fluid (colostrum) may leak from your breasts. This is the first milk  you are producing for your baby.  Your belly button may stick out.  You may notice more swelling in your hands, face, or ankles.  You may have increased tingling or numbness in your hands, arms, and legs. The skin on your belly may also feel numb.  You may feel short of breath because of your expanding uterus.  You may have more problems sleeping. This can be caused by the size of your belly, increased need to urinate, and an increase in your body's metabolism.  You may notice the fetus "dropping," or moving lower in your abdomen (lightening).  You may have increased vaginal discharge.  You may notice your joints feel loose and you may have pain around your pelvic bone.  What to expect at prenatal visits You will have prenatal exams every 2 weeks until week 36. Then you will have weekly prenatal exams. During a routine prenatal visit:  You will be weighed to make sure you and the baby are growing normally.  Your blood pressure will be taken.  Your abdomen will be measured to track your baby's growth.  The fetal heartbeat will be listened to.  Any test results from the previous visit will be discussed.  You may have a cervical check near your due date to see if your cervix has softened or thinned (effaced).  You will be tested for Group B streptococcus. This happens between 35 and 37 weeks.  Your health care provider may ask you:  What your birth plan is.  How you are feeling.  If you are feeling the baby move.  If you have had any abnormal symptoms, such as leaking fluid, bleeding, severe headaches, or abdominal cramping.  If you are using any tobacco products, including cigarettes, chewing tobacco, and electronic cigarettes.  If you have any questions.  Other tests or screenings that may be performed during your third trimester include:  Blood tests that check for low iron levels (anemia).  Fetal testing to check the health, activity level, and growth of the  fetus. Testing is done if you have certain medical conditions or if there are problems during the pregnancy.  Nonstress test (NST). This test checks the health of your baby to make sure there are no signs of problems, such as the baby not getting enough oxygen. During this test, a belt is placed around your belly. The baby is made to move, and its heart rate is monitored during movement.  What is false labor? False labor is a condition in which you feel small, irregular tightenings of the muscles in the womb (contractions) that usually go away with rest, changing position, or drinking water. These are called Braxton Hicks contractions. Contractions may last for hours, days, or even weeks before true labor sets in. If contractions come at regular intervals, become more frequent, increase in intensity, or become painful, you should see your health care provider. What are the signs of labor?  Abdominal cramps.  Regular contractions that start at 10 minutes apart and   become stronger and more frequent with time.  Contractions that start on the top of the uterus and spread down to the lower abdomen and back.  Increased pelvic pressure and dull back pain.  A watery or bloody mucus discharge that comes from the vagina.  Leaking of amniotic fluid. This is also known as your "water breaking." It could be a slow trickle or a gush. Let your health care provider know if it has a color or strange odor. If you have any of these signs, call your health care provider right away, even if it is before your due date. Follow these instructions at home: Medicines  Follow your health care provider's instructions regarding medicine use. Specific medicines may be either safe or unsafe to take during pregnancy.  Take a prenatal vitamin that contains at least 600 micrograms (mcg) of folic acid.  If you develop constipation, try taking a stool softener if your health care provider approves. Eating and  drinking  Eat a balanced diet that includes fresh fruits and vegetables, whole grains, good sources of protein such as meat, eggs, or tofu, and low-fat dairy. Your health care provider will help you determine the amount of weight gain that is right for you.  Avoid raw meat and uncooked cheese. These carry germs that can cause birth defects in the baby.  If you have low calcium intake from food, talk to your health care provider about whether you should take a daily calcium supplement.  Eat four or five small meals rather than three large meals a day.  Limit foods that are high in fat and processed sugars, such as fried and sweet foods.  To prevent constipation: ? Drink enough fluid to keep your urine clear or pale yellow. ? Eat foods that are high in fiber, such as fresh fruits and vegetables, whole grains, and beans. Activity  Exercise only as directed by your health care provider. Most women can continue their usual exercise routine during pregnancy. Try to exercise for 30 minutes at least 5 days a week. Stop exercising if you experience uterine contractions.  Avoid heavy lifting.  Do not exercise in extreme heat or humidity, or at high altitudes.  Wear low-heel, comfortable shoes.  Practice good posture.  You may continue to have sex unless your health care provider tells you otherwise. Relieving pain and discomfort  Take frequent breaks and rest with your legs elevated if you have leg cramps or low back pain.  Take warm sitz baths to soothe any pain or discomfort caused by hemorrhoids. Use hemorrhoid cream if your health care provider approves.  Wear a good support bra to prevent discomfort from breast tenderness.  If you develop varicose veins: ? Wear support pantyhose or compression stockings as told by your healthcare provider. ? Elevate your feet for 15 minutes, 3-4 times a day. Prenatal care  Write down your questions. Take them to your prenatal visits.  Keep all  your prenatal visits as told by your health care provider. This is important. Safety  Wear your seat belt at all times when driving.  Make a list of emergency phone numbers, including numbers for family, friends, the hospital, and police and fire departments. General instructions  Avoid cat litter boxes and soil used by cats. These carry germs that can cause birth defects in the baby. If you have a cat, ask someone to clean the litter box for you.  Do not travel far distances unless it is absolutely necessary and only with the   approval of your health care provider.  Do not use hot tubs, steam rooms, or saunas.  Do not drink alcohol.  Do not use any products that contain nicotine or tobacco, such as cigarettes and e-cigarettes. If you need help quitting, ask your health care provider.  Do not use any medicinal herbs or unprescribed drugs. These chemicals affect the formation and growth of the baby.  Do not douche or use tampons or scented sanitary pads.  Do not cross your legs for long periods of time.  To prepare for the arrival of your baby: ? Take prenatal classes to understand, practice, and ask questions about labor and delivery. ? Make a trial run to the hospital. ? Visit the hospital and tour the maternity area. ? Arrange for maternity or paternity leave through employers. ? Arrange for family and friends to take care of pets while you are in the hospital. ? Purchase a rear-facing car seat and make sure you know how to install it in your car. ? Pack your hospital bag. ? Prepare the baby's nursery. Make sure to remove all pillows and stuffed animals from the baby's crib to prevent suffocation.  Visit your dentist if you have not gone during your pregnancy. Use a soft toothbrush to brush your teeth and be gentle when you floss. Contact a health care provider if:  You are unsure if you are in labor or if your water has broken.  You become dizzy.  You have mild pelvic  cramps, pelvic pressure, or nagging pain in your abdominal area.  You have lower back pain.  You have persistent nausea, vomiting, or diarrhea.  You have an unusual or bad smelling vaginal discharge.  You have pain when you urinate. Get help right away if:  Your water breaks before 37 weeks.  You have regular contractions less than 5 minutes apart before 37 weeks.  You have a fever.  You are leaking fluid from your vagina.  You have spotting or bleeding from your vagina.  You have severe abdominal pain or cramping.  You have rapid weight loss or weight gain.  You have shortness of breath with chest pain.  You notice sudden or extreme swelling of your face, hands, ankles, feet, or legs.  Your baby makes fewer than 10 movements in 2 hours.  You have severe headaches that do not go away when you take medicine.  You have vision changes. Summary  The third trimester is from week 28 through week 40, months 7 through 9. The third trimester is a time when the unborn baby (fetus) is growing rapidly.  During the third trimester, your discomfort may increase as you and your baby continue to gain weight. You may have abdominal, leg, and back pain, sleeping problems, and an increased need to urinate.  During the third trimester your breasts will keep growing and they will continue to become tender. A yellow fluid (colostrum) may leak from your breasts. This is the first milk you are producing for your baby.  False labor is a condition in which you feel small, irregular tightenings of the muscles in the womb (contractions) that eventually go away. These are called Braxton Hicks contractions. Contractions may last for hours, days, or even weeks before true labor sets in.  Signs of labor can include: abdominal cramps; regular contractions that start at 10 minutes apart and become stronger and more frequent with time; watery or bloody mucus discharge that comes from the vagina; increased  pelvic pressure and dull   back pain; and leaking of amniotic fluid. This information is not intended to replace advice given to you by your health care provider. Make sure you discuss any questions you have with your health care provider. Document Released: 10/21/2001 Document Revised: 04/03/2016 Document Reviewed: 12/28/2012 Elsevier Interactive Patient Education  2017 [employer]  

## 2018-08-10 NOTE — Progress Notes (Signed)
   PRENATAL VISIT NOTE  Subjective:  Denise Clay is a 32 y.o. G3P2002 at [redacted]w[redacted]d being seen today for ongoing prenatal care.  She is currently monitored for the following issues for this low-risk pregnancy and has Encounter for supervision of normal pregnancy on their problem list.  Patient reports lower abdominal pain with walking, spitting with nausea.  Contractions: Not present. Vag. Bleeding: None.  Movement: Present. Denies leaking of fluid.   The following portions of the patient's history were reviewed and updated as appropriate: allergies, current medications, past family history, past medical history, past social history, past surgical history and problem list. Problem list updated.  Objective:   Vitals:   08/10/18 1354  BP: 120/64  Pulse: 95  Weight: 123.2 kg    Fetal Status: Fetal Heart Rate (bpm): 148   Movement: Present     VS reviewed, nursing note reviewed,  Constitutional: well developed, well nourished, no distress HEENT: normocephalic HEART: normal rate, heart sounds, regular rhythm RESP: normal effort, lung sounds clear and equal bilaterally Breast Exam:  right breast normal without mass, skin or nipple changes or axillary nodes, left breast normal without mass, skin or nipple changes or axillary nodes Abdomen: soft, gravid Neuro: alert and oriented x 3 Skin: warm, dry Psych: affect normal Pelvic exam: Deferred, pt to have pap at future visit or PP  Assessment and Plan:  Pregnancy: G3P2002 at [redacted]w[redacted]d  1. Encounter for supervision of normal pregnancy in third trimester --Anticipatory guidance about next visits/weeks of pregnancy given. GTT at next visit.  --Discussed and offered genetic screening options, including Quad screen/AFP, NIPS testing, and option to decline testing. Benefits/risks/alternatives reviewed. Pt aware that anatomy US is form of genetic screening with lower accuracy in detecting trisomies than blood work.  Pt chooses NIPS for genetic  screening today. - Culture, OB Urine - Cystic fibrosis gene test - Hemoglobinopathy Evaluation - Obstetric Panel, Including HIV - SMN1 COPY NUMBER ANALYSIS (SMA Carrier Screen) - [place] MFM OB COMP + 14 WK; Future - Genetic Screening  2. Ptyalism  - glycopyrrolate (ROBINUL) 2 MG tablet; Take 1 tablet (2 mg total) by mouth 3 (three) times daily as needed.  Dispense: 30 tablet; Refill: 3 --Sometimes associated with nausea/vomiting, pt requests Zofran.  Zofran 4 mg PO Q 8 hours PRN.  Preterm labor symptoms and general obstetric precautions including but not limited to vaginal bleeding, contractions, leaking of fluid and fetal movement were reviewed in detail with the patient. Please refer to After Visit Summary for other counseling recommendations.  Return in about 3 weeks (around 08/31/2018).  Future Appointments  Date Time Provider Department Center  08/17/2018  3:15 PM [facility] [place] 4 [facility]  09/07/2018  8:55 AM Left[place]-Kirby, Wilmer Floor, CNM [facility]    Sharen Counter, CNM

## 2018-08-10 NOTE — Progress Notes (Signed)
Pt states has been having a lot of saliva.

## 2018-08-12 LAB — CULTURE, OB URINE

## 2018-08-12 LAB — URINE CULTURE, OB REFLEX

## 2018-08-17 ENCOUNTER — Other Ambulatory Visit: Payer: Self-pay | Admitting: Advanced Practice Midwife

## 2018-08-17 ENCOUNTER — Ambulatory Visit (HOSPITAL_COMMUNITY)
Admission: RE | Admit: 2018-08-17 | Discharge: 2018-08-17 | Disposition: A | Discharge status: Home / Self Care | Payer: Medicaid Other | Source: Ambulatory Visit | Attending: Advanced Practice Midwife | Admitting: Advanced Practice Midwife

## 2018-08-17 DIAGNOSIS — Z3A26 26 weeks gestation of pregnancy: Secondary | ICD-10-CM

## 2018-08-17 DIAGNOSIS — Z3403 Encounter for supervision of normal first pregnancy, third trimester: Secondary | ICD-10-CM | POA: Diagnosis not present

## 2018-08-17 DIAGNOSIS — O283 Abnormal ultrasonic finding on antenatal screening of mother: Secondary | ICD-10-CM

## 2018-08-17 DIAGNOSIS — Z363 Encounter for antenatal screening for malformations: Secondary | ICD-10-CM | POA: Diagnosis not present

## 2018-08-18 LAB — OBSTETRIC PANEL, INCLUDING HIV
Antibody Screen: NEGATIVE
BASOS ABS: 0 10*3/uL (ref 0.0–0.2)
Basos: 0 %
EOS (ABSOLUTE): 0.1 10*3/uL (ref 0.0–0.4)
Eos: 1 %
HEMATOCRIT: 28.9 % — AB (ref 34.0–46.6)
HEP B S AG: NEGATIVE
HIV Screen 4th Generation wRfx: NONREACTIVE
Hemoglobin: 9.3 g/dL — ABNORMAL LOW (ref 11.1–15.9)
IMMATURE GRANS (ABS): 0.1 10*3/uL (ref 0.0–0.1)
Immature Granulocytes: 1 %
LYMPHS: 19 %
Lymphocytes Absolute: 2.2 10*3/uL (ref 0.7–3.1)
MCH: 27.2 pg (ref 26.6–33.0)
MCHC: 32.2 g/dL (ref 31.5–35.7)
MCV: 85 fL (ref 79–97)
Monocytes Absolute: 0.4 10*3/uL (ref 0.1–0.9)
Monocytes: 4 %
Neutrophils Absolute: 8.6 10*3/uL — ABNORMAL HIGH (ref 1.4–7.0)
Neutrophils: 75 %
PLATELETS: 350 10*3/uL (ref 150–450)
RBC: 3.42 x10E6/uL — ABNORMAL LOW (ref 3.77–5.28)
RDW: 13.4 % (ref 12.3–15.4)
RPR: NONREACTIVE
RUBELLA: 6.67 {index} (ref >0.99)
Rh Factor: POSITIVE
WBC: 11.3 10*3/uL — ABNORMAL HIGH (ref 3.4–10.8)

## 2018-08-18 LAB — SMN1 COPY NUMBER ANALYSIS (SMA CARRIER SCREENING)

## 2018-08-18 LAB — CYSTIC FIBROSIS GENE TEST

## 2018-08-18 LAB — HEMOGLOBINOPATHY EVALUATION
FERRITIN: 8 ng/mL — AB (ref 15–150)
HGB C: 0 %
HGB F QUANT: 0 % (ref 0.0–2.0)
Hgb A2 Quant: 2.2 % (ref 1.8–3.2)
Hgb A: 97.8 % (ref 96.4–98.8)
Hgb S: 0 %
Hgb Solubility: NEGATIVE
Hgb Variant: 0 %

## 2018-08-19 ENCOUNTER — Encounter: Payer: Self-pay | Admitting: *Deleted

## 2018-09-07 ENCOUNTER — Other Ambulatory Visit: Payer: Medicaid Other

## 2018-09-07 ENCOUNTER — Ambulatory Visit (INDEPENDENT_AMBULATORY_CARE_PROVIDER_SITE_OTHER): Payer: Self-pay | Admitting: Advanced Practice Midwife

## 2018-09-07 VITALS — BP 121/59 | HR 92 | Wt 267.0 lb

## 2018-09-07 DIAGNOSIS — R102 Pelvic and perineal pain: Secondary | ICD-10-CM

## 2018-09-07 DIAGNOSIS — Z3490 Encounter for supervision of normal pregnancy, unspecified, unspecified trimester: Secondary | ICD-10-CM | POA: Diagnosis not present

## 2018-09-07 DIAGNOSIS — O26899 Other specified pregnancy related conditions, unspecified trimester: Secondary | ICD-10-CM

## 2018-09-07 DIAGNOSIS — O339 Maternal care for disproportion, unspecified: Secondary | ICD-10-CM | POA: Diagnosis not present

## 2018-09-07 DIAGNOSIS — Z3482 Encounter for supervision of other normal pregnancy, second trimester: Secondary | ICD-10-CM

## 2018-09-07 NOTE — Progress Notes (Signed)
   PRENATAL VISIT NOTE  Subjective:  Denise Clay is a 32 y.o. G3P2002 at [redacted]w[redacted]d being seen today for ongoing prenatal care.  She is currently monitored for the following issues for this low-risk pregnancy and has Encounter for supervision of normal pregnancy on their problem list.  Patient reports no complaints.  Contractions: Not present. Vag. Bleeding: None.  Movement: Present. Denies leaking of fluid.   The following portions of the patient's history were reviewed and updated as appropriate: allergies, current medications, past family history, past medical history, past social history, past surgical history and problem list. Problem list updated.  Objective:   Vitals:   09/07/18 0857  BP: (!) 121/59  Pulse: 92  Weight: 121.1 kg    Fetal Status: Fetal Heart Rate (bpm): 148 Fundal Height: 30 cm Movement: Present     General:  Alert, oriented and cooperative. Patient is in no acute distress.  Skin: Skin is warm and dry. No rash noted.   Cardiovascular: Normal heart rate noted  Respiratory: Normal respiratory effort, no problems with respiration noted  Abdomen: Soft, gravid, appropriate for gestational age.  Pain/Pressure: Present     Pelvic: Cervical exam deferred        Extremities: Normal range of motion.  Edema: Trace  Mental Status: Normal mood and affect. Normal behavior. Normal judgment and thought content.   Assessment and Plan:  Pregnancy: G3P2002 at [redacted]w[redacted]d  1. Supervision of normal pregnancy --Anticipatory guidance about next visits/weeks of pregnancy given.  2. Pain of round ligament affecting pregnancy, antepartum --Intermittent sharp pain in inguinal/low abdomen areas with movement. --Rest/ice/heat/warm bath/Tylenol.   --Pt has Rx for support belt, pharmacy did not have but they have it today so she is going to get one today.  Preterm labor symptoms and general obstetric precautions including but not limited to vaginal bleeding, contractions, leaking of fluid and  fetal movement were reviewed in detail with the patient. Please refer to After Visit Summary for other counseling recommendations.  No follow-ups on file.  Future Appointments  Date Time Provider Department Center  09/21/2018  9:35 AM Crisoforo [place], Charlesetta Garibaldi, CNM [facility]    Sharen Counter, CNM

## 2018-09-07 NOTE — Patient Instructions (Signed)
Third Trimester of Pregnancy The third trimester is from week 28 through week 40 (months 7 through 9). The third trimester is a time when the unborn baby (fetus) is growing rapidly. At the end of the ninth month, the fetus is about 20 inches in length and weighs 6-10 pounds. Body changes during your third trimester Your body will continue to go through many changes during pregnancy. The changes vary from woman to woman. During the third trimester:  Your weight will continue to increase. You can expect to gain 25-35 pounds (11-16 kg) by the end of the pregnancy.  You may begin to get stretch marks on your hips, abdomen, and breasts.  You may urinate more often because the fetus is moving lower into your pelvis and pressing on your bladder.  You may develop or continue to have heartburn. This is caused by increased hormones that slow down muscles in the digestive tract.  You may develop or continue to have constipation because increased hormones slow digestion and cause the muscles that push waste through your intestines to relax.  You may develop hemorrhoids. These are swollen veins (varicose veins) in the rectum that can itch or be painful.  You may develop swollen, bulging veins (varicose veins) in your legs.  You may have increased body aches in the pelvis, back, or thighs. This is due to weight gain and increased hormones that are relaxing your joints.  You may have changes in your hair. These can include thickening of your hair, rapid growth, and changes in texture. Some women also have hair loss during or after pregnancy, or hair that feels dry or thin. Your hair will most likely return to normal after your baby is born.  Your breasts will continue to grow and they will continue to become tender. A yellow fluid (colostrum) may leak from your breasts. This is the first milk you are producing for your baby.  Your belly button may stick out.  You may notice more swelling in your hands,  face, or ankles.  You may have increased tingling or numbness in your hands, arms, and legs. The skin on your belly may also feel numb.  You may feel short of breath because of your expanding uterus.  You may have more problems sleeping. This can be caused by the size of your belly, increased need to urinate, and an increase in your body's metabolism.  You may notice the fetus "dropping," or moving lower in your abdomen (lightening).  You may have increased vaginal discharge.  You may notice your joints feel loose and you may have pain around your pelvic bone.  What to expect at prenatal visits You will have prenatal exams every 2 weeks until week 36. Then you will have weekly prenatal exams. During a routine prenatal visit:  You will be weighed to make sure you and the baby are growing normally.  Your blood pressure will be taken.  Your abdomen will be measured to track your baby's growth.  The fetal heartbeat will be listened to.  Any test results from the previous visit will be discussed.  You may have a cervical check near your due date to see if your cervix has softened or thinned (effaced).  You will be tested for Group B streptococcus. This happens between 35 and 37 weeks.  Your health care provider may ask you:  What your birth plan is.  How you are feeling.  If you are feeling the baby move.  If you have had   any abnormal symptoms, such as leaking fluid, bleeding, severe headaches, or abdominal cramping.  If you are using any tobacco products, including cigarettes, chewing tobacco, and electronic cigarettes.  If you have any questions.  Other tests or screenings that may be performed during your third trimester include:  Blood tests that check for low iron levels (anemia).  Fetal testing to check the health, activity level, and growth of the fetus. Testing is done if you have certain medical conditions or if there are problems during the  pregnancy.  Nonstress test (NST). This test checks the health of your baby to make sure there are no signs of problems, such as the baby not getting enough oxygen. During this test, a belt is placed around your belly. The baby is made to move, and its heart rate is monitored during movement.  What is false labor? False labor is a condition in which you feel small, irregular tightenings of the muscles in the womb (contractions) that usually go away with rest, changing position, or drinking water. These are called Braxton Hicks contractions. Contractions may last for hours, days, or even weeks before true labor sets in. If contractions come at regular intervals, become more frequent, increase in intensity, or become painful, you should see your health care provider. What are the signs of labor?  Abdominal cramps.  Regular contractions that start at 10 minutes apart and become stronger and more frequent with time.  Contractions that start on the top of the uterus and spread down to the lower abdomen and back.  Increased pelvic pressure and dull back pain.  A watery or bloody mucus discharge that comes from the vagina.  Leaking of amniotic fluid. This is also known as your "water breaking." It could be a slow trickle or a gush. Let your health care provider know if it has a color or strange odor. If you have any of these signs, call your health care provider right away, even if it is before your due date. Follow these instructions at home: Medicines  Follow your health care provider's instructions regarding medicine use. Specific medicines may be either safe or unsafe to take during pregnancy.  Take a prenatal vitamin that contains at least 600 micrograms (mcg) of folic acid.  If you develop constipation, try taking a stool softener if your health care provider approves. Eating and drinking  Eat a balanced diet that includes fresh fruits and vegetables, whole grains, good sources of protein  such as meat, eggs, or tofu, and low-fat dairy. Your health care provider will help you determine the amount of weight gain that is right for you.  Avoid raw meat and uncooked cheese. These carry germs that can cause birth defects in the baby.  If you have low calcium intake from food, talk to your health care provider about whether you should take a daily calcium supplement.  Eat four or five small meals rather than three large meals a day.  Limit foods that are high in fat and processed sugars, such as fried and sweet foods.  To prevent constipation: ? Drink enough fluid to keep your urine clear or pale yellow. ? Eat foods that are high in fiber, such as fresh fruits and vegetables, whole grains, and beans. Activity  Exercise only as directed by your health care provider. Most women can continue their usual exercise routine during pregnancy. Try to exercise for 30 minutes at least 5 days a week. Stop exercising if you experience uterine contractions.  Avoid heavy   lifting.  Do not exercise in extreme heat or humidity, or at high altitudes.  Wear low-heel, comfortable shoes.  Practice good posture.  You may continue to have sex unless your health care provider tells you otherwise. Relieving pain and discomfort  Take frequent breaks and rest with your legs elevated if you have leg cramps or low back pain.  Take warm sitz baths to soothe any pain or discomfort caused by hemorrhoids. Use hemorrhoid cream if your health care provider approves.  Wear a good support bra to prevent discomfort from breast tenderness.  If you develop varicose veins: ? Wear support pantyhose or compression stockings as told by your healthcare provider. ? Elevate your feet for 15 minutes, 3-4 times a day. Prenatal care  Write down your questions. Take them to your prenatal visits.  Keep all your prenatal visits as told by your health care provider. This is important. Safety  Wear your seat belt at  all times when driving.  Make a list of emergency phone numbers, including numbers for family, friends, the hospital, and police and fire departments. General instructions  Avoid cat litter boxes and soil used by cats. These carry germs that can cause birth defects in the baby. If you have a cat, ask someone to clean the litter box for you.  Do not travel far distances unless it is absolutely necessary and only with the approval of your health care provider.  Do not use hot tubs, steam rooms, or saunas.  Do not drink alcohol.  Do not use any products that contain nicotine or tobacco, such as cigarettes and e-cigarettes. If you need help quitting, ask your health care provider.  Do not use any medicinal herbs or unprescribed drugs. These chemicals affect the formation and growth of the baby.  Do not douche or use tampons or scented sanitary pads.  Do not cross your legs for long periods of time.  To prepare for the arrival of your baby: ? Take prenatal classes to understand, practice, and ask questions about labor and delivery. ? Make a trial run to the hospital. ? Visit the hospital and tour the maternity area. ? Arrange for maternity or paternity leave through employers. ? Arrange for family and friends to take care of pets while you are in the hospital. ? Purchase a rear-facing car seat and make sure you know how to install it in your car. ? Pack your hospital bag. ? Prepare the baby's nursery. Make sure to remove all pillows and stuffed animals from the baby's crib to prevent suffocation.  Visit your dentist if you have not gone during your pregnancy. Use a soft toothbrush to brush your teeth and be gentle when you floss. Contact a health care provider if:  You are unsure if you are in labor or if your water has broken.  You become dizzy.  You have mild pelvic cramps, pelvic pressure, or nagging pain in your abdominal area.  You have lower back pain.  You have persistent  nausea, vomiting, or diarrhea.  You have an unusual or bad smelling vaginal discharge.  You have pain when you urinate. Get help right away if:  Your water breaks before 37 weeks.  You have regular contractions less than 5 minutes apart before 37 weeks.  You have a fever.  You are leaking fluid from your vagina.  You have spotting or bleeding from your vagina.  You have severe abdominal pain or cramping.  You have rapid weight loss or weight gain.    You have shortness of breath with chest pain.  You notice sudden or extreme swelling of your face, hands, ankles, feet, or legs.  Your baby makes fewer than 10 movements in 2 hours.  You have severe headaches that do not go away when you take medicine.  You have vision changes. Summary  The third trimester is from week 28 through week 40, months 7 through 9. The third trimester is a time when the unborn baby (fetus) is growing rapidly.  During the third trimester, your discomfort may increase as you and your baby continue to gain weight. You may have abdominal, leg, and back pain, sleeping problems, and an increased need to urinate.  During the third trimester your breasts will keep growing and they will continue to become tender. A yellow fluid (colostrum) may leak from your breasts. This is the first milk you are producing for your baby.  False labor is a condition in which you feel small, irregular tightenings of the muscles in the womb (contractions) that eventually go away. These are called Braxton Hicks contractions. Contractions may last for hours, days, or even weeks before true labor sets in.  Signs of labor can include: abdominal cramps; regular contractions that start at 10 minutes apart and become stronger and more frequent with time; watery or bloody mucus discharge that comes from the vagina; increased pelvic pressure and dull back pain; and leaking of amniotic fluid. This information is not intended to replace advice  given to you by your health care provider. Make sure you discuss any questions you have with your health care provider. Document Released: 10/21/2001 Document Revised: 04/03/2016 Document Reviewed: 12/28/2012 Elsevier Interactive Patient Education  2017 [employer] Inc[employer]  

## 2018-09-08 LAB — CBC
HEMATOCRIT: 27.8 % — AB (ref 34.0–46.6)
HEMOGLOBIN: 8.8 g/dL — AB (ref 11.1–15.9)
MCH: 26.1 pg — AB (ref 26.6–33.0)
MCHC: 31.7 g/dL (ref 31.5–35.7)
MCV: 83 fL (ref 79–97)
Platelets: 359 10*3/uL (ref 150–450)
RBC: 3.37 x10E6/uL — ABNORMAL LOW (ref 3.77–5.28)
RDW: 12.6 % (ref 12.3–15.4)
WBC: 10.5 10*3/uL (ref 3.4–10.8)

## 2018-09-08 LAB — HIV ANTIBODY (ROUTINE TESTING W REFLEX): HIV Screen 4th Generation wRfx: NONREACTIVE

## 2018-09-08 LAB — GLUCOSE TOLERANCE, 2 HOURS W/ 1HR
GLUCOSE, 1 HOUR: 130 mg/dL (ref 65–179)
Glucose, 2 hour: 115 mg/dL (ref 65–152)
Glucose, Fasting: 80 mg/dL (ref 65–91)

## 2018-09-08 LAB — RPR: RPR: NONREACTIVE

## 2018-09-09 ENCOUNTER — Encounter: Payer: Self-pay | Admitting: Obstetrics & Gynecology

## 2018-09-09 DIAGNOSIS — O99019 Anemia complicating pregnancy, unspecified trimester: Secondary | ICD-10-CM | POA: Insufficient documentation

## 2018-09-13 ENCOUNTER — Telehealth: Payer: Self-pay | Admitting: *Deleted

## 2018-09-13 NOTE — Telephone Encounter (Signed)
-----   Message from Willodean Rosenthal, MD sent at 09/09/2018 10:21 PM EDT ----- Please call pt. She is anemic. She needs to begin FeSO4 daily. (OTC- she may need a Rx called in if her insurance covers this).   She should take this med on an empty stomach with 1/2 cup of Orange juice or Vit C. She may eat after 1/2 hour ( ).  Please warn her that her stools may turn dark and she may develop some constipation while on this medication.     Thx, clh-S

## 2018-09-13 NOTE — Telephone Encounter (Signed)
I called [place] and left a message I am calling with some non urgent information. Please call our office back.

## 2018-09-15 LAB — INFORMED CONSENT NEEDED

## 2018-09-15 NOTE — Telephone Encounter (Signed)
Called patient & informed her of results and to begin iron supplement with instructions. Patient verbalized understanding and states she has had a terrible past few days. Patient states she hasn't been able to keep down food/water since Saturday. Patient states she has tried fruit, crackers, water but everything comes right back up. Patient states the only thing she can do is lay in the bed. Patient states she has felt fatigued, dizzy & weak at times and is unable to keep nausea medication down. Recommended she go to [facility] for IV fluids and medication. Patient verbalized understanding & had no questions.

## 2018-09-21 ENCOUNTER — Ambulatory Visit (INDEPENDENT_AMBULATORY_CARE_PROVIDER_SITE_OTHER): Payer: Medicaid Other | Admitting: Student

## 2018-09-21 ENCOUNTER — Other Ambulatory Visit: Payer: Self-pay | Admitting: Student

## 2018-09-21 VITALS — BP 104/54 | HR 95 | Wt 264.0 lb

## 2018-09-21 DIAGNOSIS — Z3482 Encounter for supervision of other normal pregnancy, second trimester: Secondary | ICD-10-CM

## 2018-09-21 DIAGNOSIS — O99013 Anemia complicating pregnancy, third trimester: Secondary | ICD-10-CM

## 2018-09-21 DIAGNOSIS — O99019 Anemia complicating pregnancy, unspecified trimester: Secondary | ICD-10-CM

## 2018-09-21 MED ORDER — PSYLLIUM 0.52 G PO CAPS: 0.5200 g | ORAL_CAPSULE | Freq: Every day | ORAL | 1 refills | Status: DC

## 2018-09-21 MED ORDER — DOCUSATE SODIUM 100 MG PO CAPS: 100.0000 mg | ORAL_CAPSULE | Freq: Two times a day (BID) | ORAL | 0 refills | Status: AC

## 2018-09-21 NOTE — Patient Instructions (Addendum)
-  take fiber pill once a day -take colace twice a day  Iron Deficiency Anemia, Adult Iron-deficiency anemia is when you have a low amount of red blood cells or hemoglobin. This happens because you have too little iron in your body. Hemoglobin carries oxygen to parts of the body. Anemia can cause your body to not get enough oxygen. It may or may not cause symptoms. Follow these instructions at home: Medicines  Take over-the-counter and prescription medicines only as told by your doctor. This includes iron pills (supplements) and vitamins.  If you cannot handle taking iron pills by mouth, ask your doctor about getting iron through: ? A vein (intravenously). ? A shot (injection) into a muscle.  Take iron pills when your stomach is empty. If you cannot handle this, take them with food.  Do not drink milk or take antacids at the same time as your iron pills.  To prevent trouble pooping (constipation), eat fiber or take medicine (stool softener) as told by your doctor. Eating and drinking  Talk with your doctor before changing the foods you eat. He or she may tell you to eat foods that have a lot of iron, such as: ? Liver. ? Lowfat (lean) beef. ? Breads and cereals that have iron added to them (fortified breads and cereals). ? Eggs. ? Dried fruit. ? Dark green, leafy vegetables.  Drink enough fluid to keep your pee (urine) clear or pale yellow.  Eat fresh fruits and vegetables that are high in vitamin C. They help your body to use iron. Foods with a lot of vitamin C include: ? Oranges. ? Peppers. ? Tomatoes. ? Mangoes. General instructions  Return to your normal activities as told by your doctor. Ask your doctor what activities are safe for you.  Keep yourself clean, and keep things clean around you (your surroundings). Anemia can make you get sick more easily.  Keep all follow-up visits as told by your doctor. This is important. Contact a doctor if:  You feel sick to your  stomach (nauseous).  You throw up (vomit).  You feel weak.  You are sweating for no clear reason.  You have trouble pooping, such as: ? Pooping (having a bowel movement) less than 3 times a week. ? Straining to poop. ? Having poop that is hard, dry, or larger than normal. ? Feeling full or bloated. ? Pain in the lower belly. ? Not feeling better after pooping. Get help right away if:  You pass out (faint). If this happens, do not drive yourself to the hospital. Call your local emergency services (911 in the [place]  You have chest pain.  You have shortness of breath that: ? Is very bad. ? Gets worse with physical activity.  You have a fast heartbeat.  You get light-headed when getting up from sitting or lying down. This information is not intended to replace advice given to you by your health care provider. Make sure you discuss any questions you have with your health care provider. Document Released: 11/29/2010 Document Revised: 07/16/2016 Document Reviewed: 07/16/2016 Elsevier Interactive Patient Education  2017 [employer]

## 2018-09-21 NOTE — Progress Notes (Signed)
   PRENATAL VISIT NOTE  Subjective:  Denise Clay is a 32 y.o. G3P2002 at 6451w0d being seen today for ongoing prenatal care.  She is currently monitored for the following issues for this low-risk pregnancy and has Encounter for supervision of normal pregnancy and Anemia, antepartum on their problem list.  Patient reports no complaints. Started taking her iron yesterday.  Contractions: Not present. Vag. Bleeding: None.  Movement: Present. Denies leaking of fluid.   The following portions of the patient's history were reviewed and updated as appropriate: allergies, current medications, past family history, past medical history, past social history, past surgical history and problem list. Problem list updated.  Objective:   Vitals:   09/21/18 1052  BP: (!) 104/54  Pulse: 95  Weight: 264 lb (119.7 kg)    Fetal Status: Fetal Heart Rate (bpm): 152 Fundal Height: 31 cm Movement: Present     General:  Alert, oriented and cooperative. Patient is in no acute distress.  Skin: Skin is warm and dry. No rash noted.   Cardiovascular: Normal heart rate noted  Respiratory: Normal respiratory effort, no problems with respiration noted  Abdomen: Soft, gravid, appropriate for gestational age.  Pain/Pressure: Present     Pelvic: Cervical exam deferred        Extremities: Normal range of motion.  Edema: Trace  Mental Status: Normal mood and affect. Normal behavior. Normal judgment and thought content.   Assessment and Plan:  Pregnancy: G3P2002 at 5751w0d  1. Encounter for supervision of other normal pregnancy in second trimester -Doing well, no complaints.   2. Anemia, antepartum -Reviewed importance to taking iron; patient plans to take twice a day. Encouraged her to take with food and citrus, avoid dairy by 30 min before and after taking.  -Recheck Hgb in 4 weeks.  -Constipation measures and prevention given, RX for colace and fiber given.   Preterm labor symptoms and general obstetric  precautions including but not limited to vaginal bleeding, contractions, leaking of fluid and fetal movement were reviewed in detail with the patient. Please refer to After Visit Summary for other counseling recommendations.  Return in about 2 weeks (around 10/05/2018), or [facility].  Future Appointments  Date Time Provider Department Center  10/05/2018  3:55 PM Marylene LandKooistra, Jameriah Trotti [place], CN[facility]  10/19/2018  9:35 AM Crisoforo OxfordKooistra, Charlesetta GaribaldiKathryn [place], CNM [facility]  11/09/2018  9:35 AM Marvetta GibbonsBurleson, Brand Maleserri L, NP [facility]    Marylene LandKathryn [place] Keithan Dileonardo, [place]

## 2018-10-05 ENCOUNTER — Ambulatory Visit (INDEPENDENT_AMBULATORY_CARE_PROVIDER_SITE_OTHER): Payer: Medicaid Other | Admitting: Student

## 2018-10-05 VITALS — BP 120/58 | HR 95 | Wt 266.8 lb

## 2018-10-05 DIAGNOSIS — O99019 Anemia complicating pregnancy, unspecified trimester: Secondary | ICD-10-CM

## 2018-10-05 DIAGNOSIS — Z98891 History of uterine scar from previous surgery: Secondary | ICD-10-CM | POA: Insufficient documentation

## 2018-10-05 DIAGNOSIS — O34219 Maternal care for unspecified type scar from previous cesarean delivery: Secondary | ICD-10-CM

## 2018-10-05 DIAGNOSIS — O99013 Anemia complicating pregnancy, third trimester: Secondary | ICD-10-CM

## 2018-10-05 DIAGNOSIS — Z3482 Encounter for supervision of other normal pregnancy, second trimester: Secondary | ICD-10-CM

## 2018-10-05 DIAGNOSIS — Z3A33 33 weeks gestation of pregnancy: Secondary | ICD-10-CM

## 2018-10-05 MED ORDER — ONDANSETRON HCL 4 MG PO TABS: 8.0000 mg | ORAL_TABLET | Freq: Three times a day (TID) | ORAL | 1 refills | Status: DC | PRN

## 2018-10-05 NOTE — Patient Instructions (Signed)
Postpartum Tubal Ligation Postpartum tubal ligation (PPTL) is a procedure to close the fallopian tubes. This is done so that you cannot get pregnant. When the fallopian tubes are closed, the eggs that the ovaries release cannot enter the uterus, and sperm cannot reach the eggs. PPTL is done right after childbirth or 1-2 days after childbirth, before the uterus returns to its normal location. PPTL is sometimes called "getting your tubes tied." You should not have this procedure if you want to get pregnant someday or if you are unsure about having more children. Tell a health care provider about:  Any allergies you have.  All medicines you are taking, including vitamins, herbs, eye drops, creams, and over-the-counter medicines.  Previous problems you or members of your family have had with the use of anesthetics.  Any blood disorders you have.  Previous surgeries you have had.  Any medical conditions you may have.  Any past pregnancies. What are the risks? Generally, this is a safe procedure. However, problems may occur, including:  Infection.  Bleeding.  Injury to surrounding organs.  Side effects from anesthetics.  Failure of the procedure.  This procedure can increase your risk of a kind of pregnancy in which a fertilized egg attaches to the outside of the uterus (ectopic pregnancy). What happens before the procedure?  Ask your health care provider about: ? How much pain you can expect to have. ? What medicines you will be given for pain, especially if you are planning to breastfeed.  Follow instructions from your health care provider about eating and drinking restrictions. What happens during the procedure? If you had a vaginal delivery:  You may be given one or more of the following: ? A medicine that helps you relax (sedative). ? A medicine to numb the area (local anesthetic). ? A medicine to make you fall asleep (general anesthetic). ? A medicine that is injected  into an area of your body to numb everything below the injection site (regional anesthetic).  If you have been given a general anesthetic, a tube will be put down your throat to help you breathe.  An IV tube will be inserted into one of your veins to give you medicines and fluids during the procedure.  Your bladder may be emptied with a small tube (catheter).  An incision will be made just below your belly button.  Your fallopian tubes will be located and brought up through the incision.  Your fallopian tubes will be tied off, burned (cauterized), or blocked with a clip, ring, or clamp. A small portion in the center of each fallopian tube may be removed.  The incision will be closed with stitches (sutures).  A bandage (dressing) will be placed over the incision.  If you had a cesarean delivery:  Tubal ligation will be done through the incision that was used for the cesarean delivery of your baby.  The incision will be closed with sutures.  A dressing will be placed over the incision.  The procedure may vary among health care providers and hospitals. What happens after the procedure?  Your blood pressure, heart rate, breathing rate, and blood oxygen level will be monitored often until the medicines you were given have worn off.  You will be given pain medicine as needed.  Do not drive for 24 hours if you received a sedative. This information is not intended to replace advice given to you by your health care provider. Make sure you discuss any questions you have with your health   care provider. Document Released: 10/27/2005 Document Revised: 03/31/2016 Document Reviewed: 10/07/2015 Elsevier Interactive Patient Education  2018 [employer] Inc.  

## 2018-10-05 NOTE — Progress Notes (Signed)
   PRENATAL VISIT NOTE  Subjective:  Denise Clay is a 32 y.o. G3P2002 at 5745w0d being seen today for ongoing prenatal care.  She is currently monitored for the following issues for this low-risk pregnancy and has Encounter for supervision of normal pregnancy and Anemia, antepartum on their problem list.  Patient reports that she has occasional nausea. Reports that she feels some pelvic pressure and back aches. She is using a maternity pillow.  Contractions: Not present. Vag. Bleeding: None.  Movement: Present. Denies leaking of fluid.   The following portions of the patient's history were reviewed and updated as appropriate: allergies, current medications, past family history, past medical history, past social history, past surgical history and problem list. Problem list updated.  Objective:   Vitals:   10/05/18 1622  BP: (!) 120/58  Pulse: 95  Weight: 266 lb 12.8 oz (121 kg)    Fetal Status: Fetal Heart Rate (bpm): 145 Fundal Height: 35 cm Movement: Present     General:  Alert, oriented and cooperative. Patient is in no acute distress.  Skin: Skin is warm and dry. No rash noted.   Cardiovascular: Normal heart rate noted  Respiratory: Normal respiratory effort, no problems with respiration noted  Abdomen: Soft, gravid, appropriate for gestational age.  Pain/Pressure: Present     Pelvic: Cervical exam deferred        Extremities: Normal range of motion.  Edema: Trace  Mental Status: Normal mood and affect. Normal behavior. Normal judgment and thought content.   Assessment and Plan:  Pregnancy: G3P2002 at 645w0d  1. Encounter for supervision of other normal pregnancy in second trimester -patient desires repeat c-section; [place] -RX for Zofran given  2. Anemia, antepartum -still taking her iron twice a day, tolerating it well.   Preterm labor symptoms and general obstetric precautions including but not limited to vaginal bleeding, contractions,  leaking of fluid and fetal movement were reviewed in detail with the patient. Please refer to After Visit Summary for other counseling recommendations.  No follow-ups on file.  Future Appointments  Date Time Provider Department Center  10/19/2018  9:35 AM Marylene LandKooistra, Fil[place] Lorraine, CNM [facility]  11/09/2018  9:35 AM Marvetta GibbonsBurleson, Brand Maleserri L, NP [facility]    Marylene LandKathryn Lorraine Andrick Rust, [place]

## 2018-10-06 ENCOUNTER — Telehealth: Payer: Self-pay

## 2018-10-06 ENCOUNTER — Encounter (HOSPITAL_COMMUNITY): Payer: Self-pay

## 2018-10-06 NOTE — Telephone Encounter (Signed)
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  Pt stated that she will be able to come in on Monday to resign.

## 2018-10-15 ENCOUNTER — Other Ambulatory Visit: Payer: Self-pay | Admitting: Obstetrics & Gynecology

## 2018-10-19 ENCOUNTER — Ambulatory Visit (INDEPENDENT_AMBULATORY_CARE_PROVIDER_SITE_OTHER): Payer: Medicaid Other | Admitting: Student

## 2018-10-19 VITALS — BP 97/46 | HR 92 | Wt 270.6 lb

## 2018-10-19 DIAGNOSIS — O99019 Anemia complicating pregnancy, unspecified trimester: Secondary | ICD-10-CM | POA: Diagnosis not present

## 2018-10-19 DIAGNOSIS — Z3482 Encounter for supervision of other normal pregnancy, second trimester: Secondary | ICD-10-CM

## 2018-10-19 DIAGNOSIS — O99013 Anemia complicating pregnancy, third trimester: Secondary | ICD-10-CM

## 2018-10-19 NOTE — Progress Notes (Signed)
   PRENATAL VISIT NOTE  Subjective:  Denise Clay is a 32 y.o. G3P2002 at 4220w0d being seen today for ongoing prenatal care.  She is currently monitored for the following issues for this low-risk pregnancy and has Encounter for supervision of normal pregnancy; Anemia, antepartum; and History of C-section on their problem list.  Patient reports no complaints. Still taking iron. .  Contractions: Not present. Vag. Bleeding: None.  Movement: Present. Denies leaking of fluid.   The following portions of the patient's history were reviewed and updated as appropriate: allergies, current medications, past family history, past medical history, past social history, past surgical history and problem list. Problem list updated.  Objective:   Vitals:   10/19/18 1006  BP: (!) 97/46  Pulse: 92  Weight: 270 lb 9.6 oz (122.7 kg)    Fetal Status: Fetal Heart Rate (bpm): 142 Fundal Height: 38 cm Movement: Present     General:  Alert, oriented and cooperative. Patient is in no acute distress.  Skin: Skin is warm and dry. No rash noted.   Cardiovascular: Normal heart rate noted  Respiratory: Normal respiratory effort, no problems with respiration noted  Abdomen: Soft, gravid, appropriate for gestational age.  Pain/Pressure: Present     Pelvic: Cervical exam deferred        Extremities: Normal range of motion.  Edema: Trace  Mental Status: Normal mood and affect. Normal behavior. Normal judgment and thought content.   Assessment and Plan:  Pregnancy: G3P2002 at 1220w0d  1. Anemia, antepartum -Doing well, taking iron.  - CBC - Ferritin  2. Encounter for supervision of other normal pregnancy in second trimester -Doing well, no complaints.   Preterm labor symptoms and general obstetric precautions including but not limited to vaginal bleeding, contractions, leaking of fluid and fetal movement were reviewed in detail with the patient. Please refer to After Visit Summary for other counseling  recommendations.  No follow-ups on file.  Future Appointments  Date Time Provider Department Center  11/09/2018  9:35 AM Marvetta GibbonsBurleson, Brand Maleserri L, NP [facility]    Charlesetta GaribaldiKathryn Lorraine [place], [place]

## 2018-10-20 LAB — CBC
Hematocrit: 27.2 % — ABNORMAL LOW (ref 34.0–46.6)
Hemoglobin: 8.5 g/dL — ABNORMAL LOW (ref 11.1–15.9)
MCH: 26.1 pg — ABNORMAL LOW (ref 26.6–33.0)
MCHC: 31.3 g/dL — ABNORMAL LOW (ref 31.5–35.7)
MCV: 83 fL (ref 79–97)
Platelets: 377 10*3/uL (ref 150–450)
RBC: 3.26 x10E6/uL — ABNORMAL LOW (ref 3.77–5.28)
RDW: 13.2 % (ref 12.3–15.4)
WBC: 8.8 10*3/uL (ref 3.4–10.8)

## 2018-10-20 LAB — FERRITIN: Ferritin: 10 ng/mL — ABNORMAL LOW (ref 15–150)

## 2018-10-21 ENCOUNTER — Other Ambulatory Visit: Payer: Self-pay | Admitting: Student

## 2018-10-21 ENCOUNTER — Telehealth: Payer: Self-pay

## 2018-10-21 ENCOUNTER — Other Ambulatory Visit: Payer: Self-pay

## 2018-10-21 DIAGNOSIS — O99019 Anemia complicating pregnancy, unspecified trimester: Secondary | ICD-10-CM

## 2018-10-21 NOTE — Telephone Encounter (Signed)
Per [name], CNM pt needs iron infusion x 2 doses.  Scheduled pt 1st iron infusion for 10/26/18 @ 1200.  Notified pt that we have scheduled her iron infusions for two weeks due her iron levels.  Pt given to pt along with address to Short Stay.  Pt stated understanding with no further questions.

## 2018-10-21 NOTE — Progress Notes (Signed)
Per Susanne BordersKathyrn Kooistra, CNM pt needs a Iron infusion x 2 doses.

## 2018-10-22 ENCOUNTER — Other Ambulatory Visit: Payer: Self-pay | Admitting: Obstetrics & Gynecology

## 2018-10-26 ENCOUNTER — Other Ambulatory Visit (HOSPITAL_COMMUNITY)
Admission: RE | Admit: 2018-10-26 | Discharge: 2018-10-26 | Disposition: A | Discharge status: Home / Self Care | Payer: Medicaid Other | Source: Ambulatory Visit | Attending: Student | Admitting: Student

## 2018-10-26 ENCOUNTER — Ambulatory Visit (INDEPENDENT_AMBULATORY_CARE_PROVIDER_SITE_OTHER): Payer: Medicaid Other | Admitting: Student

## 2018-10-26 ENCOUNTER — Ambulatory Visit (HOSPITAL_COMMUNITY)
Admission: RE | Admit: 2018-10-26 | Discharge: 2018-10-26 | Disposition: A | Discharge status: Home / Self Care | Payer: Medicaid Other | Source: Ambulatory Visit | Attending: Student | Admitting: Student

## 2018-10-26 VITALS — BP 105/67 | HR 102 | Wt 265.5 lb

## 2018-10-26 DIAGNOSIS — Z3482 Encounter for supervision of other normal pregnancy, second trimester: Secondary | ICD-10-CM | POA: Diagnosis not present

## 2018-10-26 DIAGNOSIS — Z3483 Encounter for supervision of other normal pregnancy, third trimester: Secondary | ICD-10-CM

## 2018-10-26 DIAGNOSIS — D649 Anemia, unspecified: Secondary | ICD-10-CM | POA: Insufficient documentation

## 2018-10-26 DIAGNOSIS — O26843 Uterine size-date discrepancy, third trimester: Secondary | ICD-10-CM

## 2018-10-26 MED ORDER — FERROUS SULFATE 325 (65 FE) MG PO TABS: 325.0000 mg | ORAL_TABLET | Freq: Two times a day (BID) | ORAL | 1 refills | Status: AC

## 2018-10-26 MED ORDER — SODIUM CHLORIDE 0.9 % IV SOLN
510.0000 mg | INTRAVENOUS | Status: DC
  Administered 2018-10-26: 510 mg via INTRAVENOUS
  Filled 2018-10-26: qty 17

## 2018-10-26 NOTE — Progress Notes (Addendum)
   PRENATAL VISIT NOTE  Subjective:  Denise Clay is a 32 y.o. G3P2002 at 9040w0d being seen today for ongoing prenatal care.  She is currently monitored for the following issues for this low-risk pregnancy and has Encounter for supervision of normal pregnancy; Anemia, antepartum; and History of C-section on their problem list.  Patient reports nausea, vomiting and that is controlled with zofran..  Contractions: Not present. Vag. Bleeding: None.  Movement: Present. Denies leaking of fluid.   The following portions of the patient's history were reviewed and updated as appropriate: allergies, current medications, past family history, past medical history, past social history, past surgical history and problem list. Problem list updated.  Objective:   Vitals:   10/26/18 1017  BP: 105/67  Pulse: (!) 102  Weight: 265 lb 8 oz (120.4 kg)    Fetal Status: Fetal Heart Rate (bpm): 155 Fundal Height: 42 cm Movement: Present     General:  Alert, oriented and cooperative. Patient is in no acute distress.  Skin: Skin is warm and dry. No rash noted.   Cardiovascular: Normal heart rate noted  Respiratory: Normal respiratory effort, no problems with respiration noted  Abdomen: Soft, gravid, appropriate for gestational age.  Pain/Pressure: Present     Pelvic: Cervical exam performed        Extremities: Normal range of motion.  Edema: Trace  Mental Status: Normal mood and affect. Normal behavior. Normal judgment and thought content.   Assessment and Plan:  Pregnancy: G3P2002 at 3640w0d  1. Uterine size-date discrepancy in third trimester Exam obfuscated by adiposity. Current measurement 42cm.   - US MFM OB LIMITED; Future Preterm labor symptoms and general obstetric precautions including but not limited to vaginal bleeding, contractions, leaking of fluid and fetal movement were reviewed in detail with the patient. Please refer to After Visit Summary for other counseling recommendations.  No  follow-ups on file.  Future Appointments  Date Time Provider Department Center  10/26/2018 12:00 PM [facility] ROOM 8 [facility] None  11/02/2018 12:00 PM [facility] ROOM 10 [facility] None  11/04/2018  1:35 PM Kooistra, Charlesetta GaribaldiKathryn Lorraine, CNM [facility]  11/09/2018  9:35 AM Burleson, Brand Maleserri L, NP [facility]    Salomon FickJohn A Ayvah Caroll, Medical Student   -Confirmed Vertex today -Patient planning for Iron infusion today and next week -Renewed Rx for Iron pills -Will do US for growth as FH has consistently been measuring greater than dates  -Reviewed warning signs and when to return to [facility]; patient verbalized understanding.

## 2018-10-26 NOTE — Addendum Note (Signed)
Addended by: Gwendlyn DeutscherJIMERSON, COURTNEY A on: 10/26/2018 11:39 AM   Modules accepted: Orders

## 2018-10-26 NOTE — Addendum Note (Signed)
Addended by: Chrystie NoseKOOISTRA, Egan Sahlin L on: 10/26/2018 10:58 AM   Modules accepted: Orders

## 2018-10-26 NOTE — Patient Instructions (Signed)
Pregnancy and Anemia Anemia is a condition in which the concentration of red blood cells or hemoglobin in the blood is below normal. Hemoglobin is a substance in red blood cells that carries oxygen to the tissues of the body. Anemia results in not enough oxygen reaching these tissues. Anemia during pregnancy is common because the fetus uses more iron and folic acid as it is developing. Your body may not produce enough red blood cells because of this. Also, during pregnancy, the liquid part of the blood (plasma) increases by about 50%, and the red blood cells increase by only 25%. This lowers the concentration of the red blood cells and creates a natural anemia-like situation. What are the causes? The most common cause of anemia during pregnancy is not having enough iron in the body to make red blood cells (iron deficiency anemia). Other causes may include:  Folic acid deficiency.  Vitamin B12 deficiency.  Certain prescription or over-the-counter medicines.  Certain medical conditions or infections that destroy red blood cells.  A low platelet count and bleeding caused by antibodies that go through the placenta to the fetus from the mother's blood. What are the signs or symptoms? Mild anemia may not be noticeable. If it becomes severe, symptoms may include:  Tiredness.  Shortness of breath, especially with exercise.  Weakness.  Fainting.  Pale looking skin.  Headaches.  Feeling a fast or irregular heartbeat (palpitations). How is this diagnosed? The type of anemia is usually diagnosed from your family and medical history and blood tests. How is this treated? Treatment of anemia during pregnancy depends on the cause of the anemia. Treatment can include:  Supplements of iron, vitamin B12, or folic acid.  A blood transfusion. This may be needed if blood loss is severe.  Hospitalization. This may be needed if there is significant continual blood loss.  Dietary changes. Follow  these instructions at home:  Follow your dietitian's or health care provider's dietary recommendations.  Increase your vitamin C intake. This will help the stomach absorb more iron.  Eat a diet rich in iron. This would include foods such as:  Liver.  Beef.  Whole grain bread.  Eggs.  Dried fruit.  Take iron and vitamins as directed by your health care provider.  Eat green leafy vegetables. These are a good source of folic acid. Contact a health care provider if:  You have frequent or lasting headaches.  You are looking pale.  You are bruising easily. Get help right away if:  You have extreme weakness, shortness of breath, or chest pain.  You become dizzy or have trouble concentrating.  You have heavy vaginal bleeding.  You develop a rash.  You have bloody or black, tarry stools.  You faint.  You vomit up blood.  You vomit repeatedly.  You have abdominal pain.  You have a fever or persistent symptoms for more than 2-3 days.  You have a fever and your symptoms suddenly get worse.  You are dehydrated. This information is not intended to replace advice given to you by your health care provider. Make sure you discuss any questions you have with your health care provider. Document Released: 10/24/2000 Document Revised: 04/03/2016 Document Reviewed: 06/08/2013 Elsevier Interactive Patient Education  2017 Elsevier Inc.  

## 2018-10-26 NOTE — Discharge Instructions (Signed)

## 2018-10-26 NOTE — Progress Notes (Deleted)
0

## 2018-10-27 LAB — GC/CHLAMYDIA PROBE AMP (~~LOC~~) NOT AT ARMC
Chlamydia: NEGATIVE
NEISSERIA GONORRHEA: NEGATIVE

## 2018-10-29 LAB — CULTURE, BETA STREP (GROUP B ONLY): Strep Gp B Culture: NEGATIVE

## 2018-11-02 ENCOUNTER — Ambulatory Visit (HOSPITAL_COMMUNITY)
Admission: RE | Admit: 2018-11-02 | Discharge: 2018-11-02 | Disposition: A | Discharge status: Home / Self Care | Payer: Medicaid Other | Source: Ambulatory Visit | Attending: Student | Admitting: Student

## 2018-11-02 ENCOUNTER — Other Ambulatory Visit: Payer: Self-pay | Admitting: Student

## 2018-11-02 DIAGNOSIS — Z3A36 36 weeks gestation of pregnancy: Secondary | ICD-10-CM | POA: Insufficient documentation

## 2018-11-02 DIAGNOSIS — O99013 Anemia complicating pregnancy, third trimester: Secondary | ICD-10-CM | POA: Insufficient documentation

## 2018-11-02 MED ORDER — SODIUM CHLORIDE 0.9 % IV SOLN
510.0000 mg | INTRAVENOUS | Status: AC
  Administered 2018-11-02: 510 mg via INTRAVENOUS
  Filled 2018-11-02: qty 17

## 2018-11-04 ENCOUNTER — Encounter (HOSPITAL_COMMUNITY): Payer: Self-pay

## 2018-11-04 ENCOUNTER — Ambulatory Visit (INDEPENDENT_AMBULATORY_CARE_PROVIDER_SITE_OTHER): Payer: Medicaid Other | Admitting: Student

## 2018-11-04 DIAGNOSIS — Z3482 Encounter for supervision of other normal pregnancy, second trimester: Secondary | ICD-10-CM

## 2018-11-04 NOTE — Patient Instructions (Signed)

## 2018-11-04 NOTE — Progress Notes (Signed)
   PRENATAL VISIT NOTE  Subjective:  Denise Clay is a 32 y.o. G3P2002 at 765w2d being seen today for ongoing prenatal care.  She is currently monitored for the following issues for this low-risk pregnancy and has Encounter for supervision of normal pregnancy; Anemia, antepartum; and History of C-section on their problem list.  Patient reports no complaints.  Contractions: Not present. Vag. Bleeding: None.  Movement: Present. Denies leaking of fluid.   The following portions of the patient's history were reviewed and updated as appropriate: allergies, current medications, past family history, past medical history, past social history, past surgical history and problem list. Problem list updated.  Objective:   Vitals:   11/04/18 1350  BP: 117/70  Pulse: (!) 108  Weight: 262 lb 12.8 oz (119.2 kg)    Fetal Status: Fetal Heart Rate (bpm): 146 Fundal Height: 42 cm Movement: Present     General:  Alert, oriented and cooperative. Patient is in no acute distress.  Skin: Skin is warm and dry. No rash noted.   Cardiovascular: Normal heart rate noted  Respiratory: Normal respiratory effort, no problems with respiration noted  Abdomen: Soft, gravid, appropriate for gestational age.  Pain/Pressure: Present     Pelvic: Cervical exam deferred        Extremities: Normal range of motion.  Edema: Trace  Mental Status: Normal mood and affect. Normal behavior. Normal judgment and thought content.   Assessment and Plan:  Pregnancy: G3P2002 at 6465w2d  1. Encounter for supervision of other normal pregnancy in second trimester -Doing well -Has follow up for growth US  Term labor symptoms and general obstetric precautions including but not limited to vaginal bleeding, contractions, leaking of fluid and fetal movement were reviewed in detail with the patient. Please refer to After Visit Summary for other counseling recommendations.  No follow-ups on file.  Future Appointments  Date Time Provider  Department Center  11/05/2018  1:15 PM [facility] US 4 [facility]  11/09/2018  9:35 AM Currie ParisBurleson, Terri L, NP [facility]  11/15/2018  8:30 AM [facility] PAT 5 [facility] None    Marylene LandKathryn Lorraine Cavon Nicolls, CNM

## 2018-11-05 ENCOUNTER — Other Ambulatory Visit: Payer: Self-pay | Admitting: Student

## 2018-11-05 ENCOUNTER — Ambulatory Visit (HOSPITAL_COMMUNITY)
Admission: RE | Admit: 2018-11-05 | Discharge: 2018-11-05 | Disposition: A | Discharge status: Home / Self Care | Payer: Medicaid Other | Source: Ambulatory Visit | Attending: Student | Admitting: Student

## 2018-11-05 DIAGNOSIS — O26843 Uterine size-date discrepancy, third trimester: Secondary | ICD-10-CM

## 2018-11-05 DIAGNOSIS — Z3A37 37 weeks gestation of pregnancy: Secondary | ICD-10-CM | POA: Insufficient documentation

## 2018-11-08 DIAGNOSIS — Z029 Encounter for administrative examinations, unspecified: Secondary | ICD-10-CM

## 2018-11-09 ENCOUNTER — Ambulatory Visit (INDEPENDENT_AMBULATORY_CARE_PROVIDER_SITE_OTHER): Payer: Medicaid Other | Admitting: Nurse Practitioner

## 2018-11-09 ENCOUNTER — Encounter: Payer: Self-pay | Admitting: Nurse Practitioner

## 2018-11-09 VITALS — BP 114/72 | HR 106 | Wt 260.8 lb

## 2018-11-09 DIAGNOSIS — Z3483 Encounter for supervision of other normal pregnancy, third trimester: Secondary | ICD-10-CM

## 2018-11-09 DIAGNOSIS — Z98891 History of uterine scar from previous surgery: Secondary | ICD-10-CM

## 2018-11-09 DIAGNOSIS — Z3480 Encounter for supervision of other normal pregnancy, unspecified trimester: Secondary | ICD-10-CM

## 2018-11-09 NOTE — Progress Notes (Signed)
    Subjective:  Denise Clay is a 32 y.o. G3P2002 at 3759w0d being seen today for ongoing prenatal care.  She is currently monitored for the following issues for this low-risk pregnancy and has Encounter for supervision of normal pregnancy; Anemia, antepartum; and History of C-section on their problem list.  Patient reports occasional contractions.  Contractions: Irritability. Vag. Bleeding: None.  Movement: Present. Denies leaking of fluid.   The following portions of the patient's history were reviewed and updated as appropriate: allergies, current medications, past family history, past medical history, past social history, past surgical history and problem list. Problem list updated.  Objective:   Vitals:   11/09/18 1025  BP: 114/72  Pulse: (!) 106  Weight: 260 lb 12.8 oz (118.3 kg)    Fetal Status: Fetal Heart Rate (bpm): 143 Fundal Height: 40 cm Movement: Present     General:  Alert, oriented and cooperative. Patient is in no acute distress.  Skin: Skin is warm and dry. No rash noted.   Cardiovascular: Normal heart rate noted  Respiratory: Normal respiratory effort, no problems with respiration noted  Abdomen: Soft, gravid, appropriate for gestational age. Pain/Pressure: Present     Pelvic:  Cervical exam deferred        Extremities: Normal range of motion.  Edema: Trace  Mental Status: Normal mood and affect. Normal behavior. Normal judgment and thought content.   Urinalysis:      Assessment and Plan:  Pregnancy: G3P2002 at 5959w0d  1. Supervision of other normal pregnancy, antepartum Return to clinic or [facility] if having contractions  2. History of C-section Planned repeat C/S is in one week.  Will not need to come to clinic unless having problems.  Will schedule PP visit.  Term labor symptoms and general obstetric precautions including but not limited to vaginal bleeding, contractions, leaking of fluid and fetal movement were reviewed in detail with the patient. Please  refer to After Visit Summary for other counseling recommendations.  Return for make post partum appointment in 5 weeks.  Denise BernheimERRI BURLESON, RN, MSN, NP-BC Nurse Practitioner, [facility], [facility] 11/09/2018 10:42 AM

## 2018-11-09 NOTE — Patient Instructions (Signed)
Braxton Hicks Contractions Contractions of the uterus can occur throughout pregnancy, but they are not always a sign that you are in labor. You may have practice contractions called Braxton Hicks contractions. These false labor contractions are sometimes confused with true labor. What are Braxton Hicks contractions? Braxton Hicks contractions are tightening movements that occur in the muscles of the uterus before labor. Unlike true labor contractions, these contractions do not result in opening (dilation) and thinning of the cervix. Toward the end of pregnancy (32-34 weeks), Braxton Hicks contractions can happen more often and may become stronger. These contractions are sometimes difficult to tell apart from true labor because they can be very uncomfortable. You should not feel embarrassed if you go to the hospital with false labor. Sometimes, the only way to tell if you are in true labor is for your health care provider to look for changes in the cervix. The health care provider will do a physical exam and may monitor your contractions. If you are not in true labor, the exam should show that your cervix is not dilating and your water has not broken. If there are no other health problems associated with your pregnancy, it is completely safe for you to be sent home with false labor. You may continue to have Braxton Hicks contractions until you go into true labor. How to tell the difference between true labor and false labor True labor  Contractions last 30-70 seconds.  Contractions become very regular.  Discomfort is usually felt in the top of the uterus, and it spreads to the lower abdomen and low back.  Contractions do not go away with walking.  Contractions usually become more intense and increase in frequency.  The cervix dilates and gets thinner. False labor  Contractions are usually shorter and not as strong as true labor contractions.  Contractions are usually irregular.  Contractions  are often felt in the front of the lower abdomen and in the groin.  Contractions may go away when you walk around or change positions while lying down.  Contractions get weaker and are shorter-lasting as time goes on.  The cervix usually does not dilate or become thin. Follow these instructions at home:   Take over-the-counter and prescription medicines only as told by your health care provider.  Keep up with your usual exercises and follow other instructions from your health care provider.  Eat and drink lightly if you think you are going into labor.  If Braxton Hicks contractions are making you uncomfortable: ? Change your position from lying down or resting to walking, or change from walking to resting. ? Sit and rest in a tub of warm water. ? Drink enough fluid to keep your urine pale yellow. Dehydration may cause these contractions. ? Do slow and deep breathing several times an hour.  Keep all follow-up prenatal visits as told by your health care provider. This is important. Contact a health care provider if:  You have a fever.  You have continuous pain in your abdomen. Get help right away if:  Your contractions become stronger, more regular, and closer together.  You have fluid leaking or gushing from your vagina.  You pass blood-tinged mucus (bloody show).  You have bleeding from your vagina.  You have low back pain that you never had before.  You feel your baby's head pushing down and causing pelvic pressure.  Your baby is not moving inside you as much as it used to. Summary  Contractions that occur before labor are   called Braxton Hicks contractions, false labor, or practice contractions.  Braxton Hicks contractions are usually shorter, weaker, farther apart, and less regular than true labor contractions. True labor contractions usually become progressively stronger and regular, and they become more frequent.  Manage discomfort from Braxton Hicks contractions  by changing position, resting in a warm bath, drinking plenty of water, or practicing deep breathing. This information is not intended to replace advice given to you by your health care provider. Make sure you discuss any questions you have with your health care provider. Document Released: 03/12/2017 Document Revised: 08/11/2017 Document Reviewed: 03/12/2017 Elsevier Interactive Patient Education  2019 Elsevier Inc.  

## 2018-11-15 ENCOUNTER — Inpatient Hospital Stay (HOSPITAL_COMMUNITY): Payer: Medicaid Other | Admitting: Anesthesiology

## 2018-11-15 ENCOUNTER — Inpatient Hospital Stay (HOSPITAL_COMMUNITY)
Admission: AD | Admit: 2018-11-15 | Discharge: 2018-11-17 | DRG: 785 | Disposition: A | Discharge status: Home / Self Care | Payer: Medicaid Other | Attending: Family Medicine | Admitting: Family Medicine

## 2018-11-15 ENCOUNTER — Encounter (HOSPITAL_COMMUNITY): Payer: Self-pay

## 2018-11-15 ENCOUNTER — Encounter (HOSPITAL_COMMUNITY)
Admission: AD | Disposition: A | Discharge status: Home / Self Care | Payer: Self-pay | Source: Home / Self Care | Attending: Family Medicine

## 2018-11-15 ENCOUNTER — Encounter (HOSPITAL_COMMUNITY)
Admission: RE | Admit: 2018-11-15 | Discharge: 2018-11-15 | Disposition: A | Discharge status: Home / Self Care | Payer: Medicaid Other | Source: Ambulatory Visit

## 2018-11-15 ENCOUNTER — Other Ambulatory Visit: Payer: Self-pay

## 2018-11-15 DIAGNOSIS — Z3480 Encounter for supervision of other normal pregnancy, unspecified trimester: Secondary | ICD-10-CM

## 2018-11-15 DIAGNOSIS — O34219 Maternal care for unspecified type scar from previous cesarean delivery: Secondary | ICD-10-CM | POA: Diagnosis present

## 2018-11-15 DIAGNOSIS — Z3A38 38 weeks gestation of pregnancy: Secondary | ICD-10-CM | POA: Diagnosis not present

## 2018-11-15 DIAGNOSIS — O9902 Anemia complicating childbirth: Secondary | ICD-10-CM | POA: Diagnosis present

## 2018-11-15 DIAGNOSIS — Z302 Encounter for sterilization: Secondary | ICD-10-CM

## 2018-11-15 DIAGNOSIS — O4290 Premature rupture of membranes, unspecified as to length of time between rupture and onset of labor, unspecified weeks of gestation: Secondary | ICD-10-CM | POA: Diagnosis not present

## 2018-11-15 DIAGNOSIS — Z9851 Tubal ligation status: Secondary | ICD-10-CM

## 2018-11-15 DIAGNOSIS — Z87891 Personal history of nicotine dependence: Secondary | ICD-10-CM

## 2018-11-15 DIAGNOSIS — O4292 Full-term premature rupture of membranes, unspecified as to length of time between rupture and onset of labor: Secondary | ICD-10-CM | POA: Diagnosis present

## 2018-11-15 DIAGNOSIS — O34211 Maternal care for low transverse scar from previous cesarean delivery: Secondary | ICD-10-CM | POA: Diagnosis not present

## 2018-11-15 DIAGNOSIS — Z98891 History of uterine scar from previous surgery: Secondary | ICD-10-CM

## 2018-11-15 DIAGNOSIS — O99214 Obesity complicating childbirth: Secondary | ICD-10-CM | POA: Diagnosis present

## 2018-11-15 DIAGNOSIS — Z885 Allergy status to narcotic agent status: Secondary | ICD-10-CM | POA: Diagnosis not present

## 2018-11-15 DIAGNOSIS — Z3009 Encounter for other general counseling and advice on contraception: Secondary | ICD-10-CM | POA: Diagnosis present

## 2018-11-15 DIAGNOSIS — O99019 Anemia complicating pregnancy, unspecified trimester: Secondary | ICD-10-CM | POA: Diagnosis present

## 2018-11-15 DIAGNOSIS — O4202 Full-term premature rupture of membranes, onset of labor within 24 hours of rupture: Secondary | ICD-10-CM | POA: Diagnosis not present

## 2018-11-15 HISTORY — DX: Anemia, unspecified: D64.9

## 2018-11-15 LAB — CBC
HCT: 33.2 % — ABNORMAL LOW (ref 36.0–46.0)
Hemoglobin: 10.4 g/dL — ABNORMAL LOW (ref 12.0–15.0)
MCH: 26.7 pg (ref 26.0–34.0)
MCHC: 31.3 g/dL (ref 30.0–36.0)
MCV: 85.3 fL (ref 80.0–100.0)
Platelets: 316 10*3/uL (ref 150–400)
RBC: 3.89 MIL/uL (ref 3.87–5.11)
RDW: 16.8 % — ABNORMAL HIGH (ref 11.5–15.5)
WBC: 7.9 10*3/uL (ref 4.0–10.5)
nRBC: 0 % (ref 0.0–0.2)

## 2018-11-15 LAB — COMPREHENSIVE METABOLIC PANEL
ALT: 14 U/L (ref 0–44)
AST: 16 U/L (ref 15–41)
Albumin: 3.2 g/dL — ABNORMAL LOW (ref 3.5–5.0)
Alkaline Phosphatase: 105 U/L (ref 38–126)
Anion gap: 8 (ref 5–15)
BILIRUBIN TOTAL: 0.8 mg/dL (ref 0.3–1.2)
BUN: 8 mg/dL (ref 6–20)
CO2: 18 mmol/L — ABNORMAL LOW (ref 22–32)
Calcium: 8.8 mg/dL — ABNORMAL LOW (ref 8.9–10.3)
Chloride: 106 mmol/L (ref 98–111)
Creatinine, Ser: 0.51 mg/dL (ref 0.44–1.00)
GFR calc Af Amer: >60 mL/min (ref >60)
GFR calc non Af Amer: >60 mL/min (ref >60)
Glucose, Bld: 81 mg/dL (ref 70–99)
POTASSIUM: 4 mmol/L (ref 3.5–5.1)
Sodium: 132 mmol/L — ABNORMAL LOW (ref 135–145)
TOTAL PROTEIN: 6.7 g/dL (ref 6.5–8.1)

## 2018-11-15 LAB — TYPE AND SCREEN
ABO/RH(D): B POS
ANTIBODY SCREEN: NEGATIVE

## 2018-11-15 LAB — POCT FERN TEST: POCT Fern Test: POSITIVE

## 2018-11-15 LAB — ABO/RH: ABO/RH(D): B POS

## 2018-11-15 SURGERY — Surgical Case
Anesthesia: Spinal | Site: Abdomen | Laterality: Bilateral | Wound class: Clean Contaminated

## 2018-11-15 MED ORDER — NALBUPHINE HCL 10 MG/ML IJ SOLN: 5.0000 mg | Freq: Once | INTRAMUSCULAR | Status: DC | PRN

## 2018-11-15 MED ORDER — SIMETHICONE 80 MG PO CHEW
80.0000 mg | CHEWABLE_TABLET | Freq: Three times a day (TID) | ORAL | Status: DC
  Administered 2018-11-16 – 2018-11-17 (×4): 80 mg via ORAL
  Filled 2018-11-15 (×4): qty 1

## 2018-11-15 MED ORDER — ONDANSETRON HCL 4 MG/2ML IJ SOLN: 4.0000 mg | Freq: Three times a day (TID) | INTRAMUSCULAR | Status: DC | PRN

## 2018-11-15 MED ORDER — MEPERIDINE HCL 25 MG/ML IJ SOLN: 6.2500 mg | INTRAMUSCULAR | Status: DC | PRN

## 2018-11-15 MED ORDER — ACETAMINOPHEN 500 MG PO TABS
1000.0000 mg | ORAL_TABLET | Freq: Four times a day (QID) | ORAL | Status: AC
  Administered 2018-11-16 (×3): 1000 mg via ORAL
  Filled 2018-11-15 (×4): qty 2

## 2018-11-15 MED ORDER — NALOXONE HCL 4 MG/10ML IJ SOLN: 1.0000 ug/kg/h | INTRAVENOUS | Status: DC | PRN

## 2018-11-15 MED ORDER — SODIUM CHLORIDE 0.9% FLUSH: 3.0000 mL | INTRAVENOUS | Status: DC | PRN

## 2018-11-15 MED ORDER — SIMETHICONE 80 MG PO CHEW
80.0000 mg | CHEWABLE_TABLET | ORAL | Status: DC
  Administered 2018-11-16 – 2018-11-17 (×2): 80 mg via ORAL
  Filled 2018-11-15 (×2): qty 1

## 2018-11-15 MED ORDER — DEXTROSE 5 % IV SOLN
3.0000 g | Freq: Once | INTRAVENOUS | Status: AC
  Administered 2018-11-15: 3 g via INTRAVENOUS
  Filled 2018-11-15: qty 3

## 2018-11-15 MED ORDER — NALOXONE HCL 0.4 MG/ML IJ SOLN: 0.4000 mg | INTRAMUSCULAR | Status: DC | PRN

## 2018-11-15 MED ORDER — MENTHOL 3 MG MT LOZG: 1.0000 | LOZENGE | OROMUCOSAL | Status: DC | PRN

## 2018-11-15 MED ORDER — PRENATAL MULTIVITAMIN CH
1.0000 | ORAL_TABLET | Freq: Every day | ORAL | Status: DC
  Administered 2018-11-16 – 2018-11-17 (×2): 1 via ORAL
  Filled 2018-11-15 (×2): qty 1

## 2018-11-15 MED ORDER — NALBUPHINE HCL 10 MG/ML IJ SOLN: 5.0000 mg | INTRAMUSCULAR | Status: DC | PRN

## 2018-11-15 MED ORDER — DIPHENHYDRAMINE HCL 50 MG/ML IJ SOLN
12.5000 mg | INTRAMUSCULAR | Status: DC | PRN
  Administered 2018-11-15: 12.5 mg via INTRAVENOUS

## 2018-11-15 MED ORDER — LACTATED RINGERS IV SOLN
INTRAVENOUS | Status: DC
  Administered 2018-11-15: 22:00:00 via INTRAVENOUS

## 2018-11-15 MED ORDER — FENTANYL CITRATE (PF) 100 MCG/2ML IJ SOLN
INTRAMUSCULAR | Status: DC | PRN
  Administered 2018-11-15: 20 ug via INTRATHECAL

## 2018-11-15 MED ORDER — SCOPOLAMINE 1 MG/3DAYS TD PT72
MEDICATED_PATCH | TRANSDERMAL | Status: DC | PRN
  Administered 2018-11-15: 1 via TRANSDERMAL

## 2018-11-15 MED ORDER — DIPHENHYDRAMINE HCL 50 MG/ML IJ SOLN
INTRAMUSCULAR | Status: AC
  Filled 2018-11-15: qty 1

## 2018-11-15 MED ORDER — ZOLPIDEM TARTRATE 5 MG PO TABS: 5.0000 mg | ORAL_TABLET | Freq: Every evening | ORAL | Status: DC | PRN

## 2018-11-15 MED ORDER — DIBUCAINE 1 % RE OINT: 1.0000 "application " | TOPICAL_OINTMENT | RECTAL | Status: DC | PRN

## 2018-11-15 MED ORDER — OXYTOCIN 40 UNITS IN LACTATED RINGERS INFUSION - SIMPLE MED: 2.5000 [IU]/h | INTRAVENOUS | Status: AC

## 2018-11-15 MED ORDER — BUPIVACAINE HCL (PF) 0.25 % IJ SOLN
INTRAMUSCULAR | Status: AC
  Filled 2018-11-15: qty 30

## 2018-11-15 MED ORDER — ONDANSETRON HCL 4 MG/2ML IJ SOLN
INTRAMUSCULAR | Status: DC | PRN
  Administered 2018-11-15: 4 mg via INTRAVENOUS

## 2018-11-15 MED ORDER — LACTATED RINGERS IV SOLN
INTRAVENOUS | Status: DC | PRN
  Administered 2018-11-15: 15:00:00 via INTRAVENOUS

## 2018-11-15 MED ORDER — ENOXAPARIN SODIUM 60 MG/0.6ML ~~LOC~~ SOLN
60.0000 mg | SUBCUTANEOUS | Status: DC
  Administered 2018-11-16 – 2018-11-17 (×2): 60 mg via SUBCUTANEOUS
  Filled 2018-11-15 (×3): qty 0.6

## 2018-11-15 MED ORDER — OXYTOCIN 10 UNIT/ML IJ SOLN
INTRAMUSCULAR | Status: DC | PRN
  Administered 2018-11-15: 40 [IU] via INTRAMUSCULAR

## 2018-11-15 MED ORDER — KETOROLAC TROMETHAMINE 30 MG/ML IJ SOLN
INTRAMUSCULAR | Status: AC
  Filled 2018-11-15: qty 1

## 2018-11-15 MED ORDER — BUPIVACAINE HCL (PF) 0.25 % IJ SOLN
INTRAMUSCULAR | Status: DC | PRN
  Administered 2018-11-15: 30 mL

## 2018-11-15 MED ORDER — FENTANYL CITRATE (PF) 100 MCG/2ML IJ SOLN
INTRAMUSCULAR | Status: AC
  Filled 2018-11-15: qty 2

## 2018-11-15 MED ORDER — CEFAZOLIN SODIUM-DEXTROSE 2-3 GM-%(50ML) IV SOLR: INTRAVENOUS | Status: DC | PRN

## 2018-11-15 MED ORDER — PHENYLEPHRINE 8 MG IN D5W 100 ML (0.08MG/ML) PREMIX OPTIME
INJECTION | INTRAVENOUS | Status: DC | PRN
  Administered 2018-11-15: 60 ug/min via INTRAVENOUS

## 2018-11-15 MED ORDER — LACTATED RINGERS IV SOLN
INTRAVENOUS | Status: DC
  Administered 2018-11-15 (×3): via INTRAVENOUS

## 2018-11-15 MED ORDER — SODIUM CHLORIDE 0.9 % IV SOLN
500.0000 mg | Freq: Once | INTRAVENOUS | Status: AC
  Administered 2018-11-15: 500 mg via INTRAVENOUS
  Filled 2018-11-15: qty 500

## 2018-11-15 MED ORDER — MORPHINE SULFATE (PF) 0.5 MG/ML IJ SOLN
INTRAMUSCULAR | Status: AC
  Filled 2018-11-15: qty 10

## 2018-11-15 MED ORDER — DIPHENHYDRAMINE HCL 25 MG PO CAPS
25.0000 mg | ORAL_CAPSULE | ORAL | Status: DC | PRN
  Filled 2018-11-15: qty 1

## 2018-11-15 MED ORDER — SCOPOLAMINE 1 MG/3DAYS TD PT72: 1.0000 | MEDICATED_PATCH | Freq: Once | TRANSDERMAL | Status: DC

## 2018-11-15 MED ORDER — PHENYLEPHRINE HCL 10 MG/ML IJ SOLN
INTRAMUSCULAR | Status: DC | PRN
  Administered 2018-11-15: 160 ug via INTRAVENOUS
  Administered 2018-11-15: 240 ug via INTRAVENOUS
  Administered 2018-11-15 (×3): 80 ug via INTRAVENOUS
  Administered 2018-11-15: 160 ug via INTRAVENOUS

## 2018-11-15 MED ORDER — WITCH HAZEL-GLYCERIN EX PADS: 1.0000 "application " | MEDICATED_PAD | CUTANEOUS | Status: DC | PRN

## 2018-11-15 MED ORDER — SENNOSIDES-DOCUSATE SODIUM 8.6-50 MG PO TABS
2.0000 | ORAL_TABLET | ORAL | Status: DC
  Administered 2018-11-16 – 2018-11-17 (×2): 2 via ORAL
  Filled 2018-11-15 (×2): qty 2

## 2018-11-15 MED ORDER — STERILE WATER FOR IRRIGATION IR SOLN
Status: DC | PRN
  Administered 2018-11-15: 1000 mL

## 2018-11-15 MED ORDER — FENTANYL CITRATE (PF) 100 MCG/2ML IJ SOLN: 25.0000 ug | INTRAMUSCULAR | Status: DC | PRN

## 2018-11-15 MED ORDER — SIMETHICONE 80 MG PO CHEW: 80.0000 mg | CHEWABLE_TABLET | ORAL | Status: DC | PRN

## 2018-11-15 MED ORDER — SODIUM CHLORIDE 0.9 % IR SOLN
Status: DC | PRN
  Administered 2018-11-15: 1000 mL

## 2018-11-15 MED ORDER — FAMOTIDINE IN NACL 20-0.9 MG/50ML-% IV SOLN
20.0000 mg | Freq: Once | INTRAVENOUS | Status: AC
  Administered 2018-11-15: 20 mg via INTRAVENOUS
  Filled 2018-11-15: qty 50

## 2018-11-15 MED ORDER — DEXAMETHASONE SODIUM PHOSPHATE 10 MG/ML IJ SOLN
INTRAMUSCULAR | Status: DC | PRN
  Administered 2018-11-15: 10 mg via INTRAVENOUS

## 2018-11-15 MED ORDER — PROMETHAZINE HCL 25 MG/ML IJ SOLN: 6.2500 mg | INTRAMUSCULAR | Status: DC | PRN

## 2018-11-15 MED ORDER — SOD CITRATE-CITRIC ACID 500-334 MG/5ML PO SOLN
30.0000 mL | Freq: Once | ORAL | Status: AC
  Administered 2018-11-15: 30 mL via ORAL
  Filled 2018-11-15: qty 15

## 2018-11-15 MED ORDER — TETANUS-DIPHTH-ACELL PERTUSSIS 5-2.5-18.5 LF-MCG/0.5 IM SUSP: 0.5000 mL | Freq: Once | INTRAMUSCULAR | Status: DC

## 2018-11-15 MED ORDER — KETOROLAC TROMETHAMINE 30 MG/ML IJ SOLN
30.0000 mg | Freq: Four times a day (QID) | INTRAMUSCULAR | Status: AC
  Administered 2018-11-15 – 2018-11-16 (×4): 30 mg via INTRAVENOUS
  Filled 2018-11-15 (×3): qty 1

## 2018-11-15 MED ORDER — GLYCOPYRROLATE 0.2 MG/ML IJ SOLN
INTRAMUSCULAR | Status: DC | PRN
  Administered 2018-11-15: 0.2 mg via INTRAVENOUS

## 2018-11-15 MED ORDER — CEFAZOLIN SODIUM-DEXTROSE 2-4 GM/100ML-% IV SOLN: 2.0000 g | INTRAVENOUS | Status: DC

## 2018-11-15 MED ORDER — COCONUT OIL OIL: 1.0000 "application " | TOPICAL_OIL | Status: DC | PRN

## 2018-11-15 MED ORDER — DIPHENHYDRAMINE HCL 25 MG PO CAPS: 25.0000 mg | ORAL_CAPSULE | Freq: Four times a day (QID) | ORAL | Status: DC | PRN

## 2018-11-15 SURGICAL SUPPLY — 33 items
BENZOIN TINCTURE PRP APPL 2/3 (GAUZE/BANDAGES/DRESSINGS) ×3 IMPLANT
CHLORAPREP W/TINT 26ML (MISCELLANEOUS) ×3 IMPLANT
CLAMP CORD UMBIL (MISCELLANEOUS) ×2 IMPLANT
CLIP FILSHIE TUBAL LIGA STRL (Clip) ×2 IMPLANT
CLOSURE WOUND 1/2 X4 (GAUZE/BANDAGES/DRESSINGS) ×1
CLOTH BEACON ORANGE TIMEOUT ST (SAFETY) ×3 IMPLANT
DRSG OPSITE POSTOP 4X10 (GAUZE/BANDAGES/DRESSINGS) ×3 IMPLANT
ELECT REM PT RETURN 9FT ADLT (ELECTROSURGICAL) ×3
ELECTRODE REM PT RTRN 9FT ADLT (ELECTROSURGICAL) ×1 IMPLANT
GAUZE SPONGE 4X4 12PLY STRL LF (GAUZE/BANDAGES/DRESSINGS) ×4 IMPLANT
GLOVE BIOGEL PI IND STRL 7.0 (GLOVE) ×2 IMPLANT
GLOVE BIOGEL PI INDICATOR 7.0 (GLOVE) ×4
GLOVE ECLIPSE 7.0 STRL STRAW (GLOVE) ×6 IMPLANT
GOWN STRL REUS W/TWL LRG LVL3 (GOWN DISPOSABLE) ×6 IMPLANT
NEEDLE HYPO 22GX1.5 SAFETY (NEEDLE) ×3 IMPLANT
NS IRRIG 1000ML POUR BTL (IV SOLUTION) ×3 IMPLANT
PACK C SECTION WH (CUSTOM PROCEDURE TRAY) ×3 IMPLANT
PAD ABD 7.5X8 STRL (GAUZE/BANDAGES/DRESSINGS) ×3 IMPLANT
PAD ABD 8X7 1/2 STERILE (GAUZE/BANDAGES/DRESSINGS) ×2 IMPLANT
PAD OB MATERNITY 4.3X12.25 (PERSONAL CARE ITEMS) ×3 IMPLANT
PENCIL SMOKE EVAC W/HOLSTER (ELECTROSURGICAL) ×3 IMPLANT
RETRACTOR TRAXI PANNICULUS (MISCELLANEOUS) IMPLANT
RTRCTR C-SECT PINK 25CM LRG (MISCELLANEOUS) ×3 IMPLANT
SPONGE LAP 18X18 RF (DISPOSABLE) ×11 IMPLANT
STRIP CLOSURE SKIN 1/2X4 (GAUZE/BANDAGES/DRESSINGS) ×2 IMPLANT
SUT VIC AB 0 CTX 36 (SUTURE) ×6
SUT VIC AB 0 CTX36XBRD ANBCTRL (SUTURE) ×3 IMPLANT
SUT VIC AB 4-0 KS 27 (SUTURE) ×3 IMPLANT
SYR 30ML LL (SYRINGE) ×3 IMPLANT
TAPE CLOTH SURG 4X10 WHT LF (GAUZE/BANDAGES/DRESSINGS) ×2 IMPLANT
TOWEL OR 17X24 6PK STRL BLUE (TOWEL DISPOSABLE) ×3 IMPLANT
TRAXI PANNICULUS RETRACTOR (MISCELLANEOUS) ×2
TRAY FOLEY W/BAG SLVR 14FR LF (SET/KITS/TRAYS/PACK) ×3 IMPLANT

## 2018-11-15 NOTE — Patient Instructions (Signed)
Daylene Katayama  11/15/2018   Your procedure is scheduled on:  11/16/2018  Enter through the Main Entrance of [facility] at 0730 AM.  Pick up the phone at the desk and dial [phone]  Call this number if you have problems the morning of surgery:[phone]  Remember:   Do not eat food:(After Midnight) Desps de medianoche.  Do not drink clear liquids: (After Midnight) Desps de medianoche.  Take these medicines the morning of surgery with A SIP OF WATER: none   Do not wear jewelry, make-up or nail polish.  Do not wear lotions, powders, or perfumes. Do not wear deodorant.  Do not shave 48 hours prior to surgery.  Do not bring valuables to the hospital.  [facility] is not   responsible for any belongings or valuables brought to the hospital.  Contacts, dentures or bridgework may not be worn into surgery.  Leave suitcase in the car. After surgery it may be brought to your room.  For patients admitted to the hospital, checkout time is 11:00 AM the day of              discharge.    N/A   Please read over the following fact sheets that you were given:   Surgical Site Infection Prevention

## 2018-11-15 NOTE — Progress Notes (Signed)
Old drainage noted at the bottom of the pressure dressing at 1945 but appears to be coming from the perineum. Upon reassessing the patient at 2100 RN noted new drainage at the bottom of the pressure dressing, unsure whether the bleeding is coming from the perineum or incision. No bleeding noted from either with fundal rub. Pt denies feeling dizzy, lightheaded, or weak. Will continue to monitor the patient closely and contact MD as needed.

## 2018-11-15 NOTE — Anesthesia Preprocedure Evaluation (Addendum)
Anesthesia Evaluation  Patient identified by MRN, date of birth, ID band Patient awake    Reviewed: Allergy & Precautions, NPO status , Patient's Chart, lab work & pertinent test results  History of Anesthesia Complications Negative for: history of anesthetic complications  Airway Mallampati: II  TM Distance: >3 FB Neck ROM: Full    Dental no notable dental hx. (+) Dental Advisory Given   Pulmonary former smoker,    Pulmonary exam normal        Cardiovascular negative cardio ROS Normal cardiovascular exam     Neuro/Psych negative neurological ROS  negative psych ROS   GI/Hepatic negative GI ROS, Neg liver ROS,   Endo/Other  Morbid obesity  Renal/GU negative Renal ROS  negative genitourinary   Musculoskeletal negative musculoskeletal ROS (+)   Abdominal   Peds negative pediatric ROS (+)  Hematology negative hematology ROS (+)   Anesthesia Other Findings   Reproductive/Obstetrics (+) Pregnancy                            Anesthesia Physical Anesthesia Plan  ASA: III  Anesthesia Plan: Spinal   Post-op Pain Management:    Induction: Intravenous  PONV Risk Score and Plan: 3 and Ondansetron, Scopolamine patch - Pre-op and Diphenhydramine  Airway Management Planned: Natural Airway and Simple Face Mask  Additional Equipment:   Intra-op Plan:   Post-operative Plan:   Informed Consent: I have reviewed the patients History and Physical, chart, labs and discussed the procedure including the risks, benefits and alternatives for the proposed anesthesia with the patient or authorized representative who has indicated his/her understanding and acceptance.   Dental advisory given  Plan Discussed with: CRNA and Anesthesiologist  Anesthesia Plan Comments:        Anesthesia Quick Evaluation

## 2018-11-15 NOTE — Anesthesia Postprocedure Evaluation (Signed)
Anesthesia Post Note  Patient: Denise KatayamaBrittany Semel  Procedure(s) Performed: CESAREAN SECTION WITH BILATERAL TUBAL LIGATION (Bilateral Abdomen)     Patient location during evaluation: PACU Anesthesia Type: Spinal Level of consciousness: awake and alert Pain management: pain level controlled Vital Signs Assessment: post-procedure vital signs reviewed and stable Respiratory status: spontaneous breathing and respiratory function stable Cardiovascular status: blood pressure returned to baseline and stable Postop Assessment: spinal receding Anesthetic complications: no    Last Vitals:  Vitals:   11/15/18 1700 11/15/18 1715  BP: (!) 105/57 106/65  Pulse: 93 90  Resp: 18 18  Temp:  36.7 C  SpO2: 100% 100%    Last Pain:  Vitals:   11/15/18 1715  TempSrc: Oral  PainSc: 0-No pain   Pain Goal:                 [name] DANIEL

## 2018-11-15 NOTE — MAU Note (Signed)
OR co-ordinator. House coverage, and nursery notified

## 2018-11-15 NOTE — Transfer of Care (Signed)
Immediate Anesthesia Transfer of Care Note  Patient: Daylene Katayama  Procedure(s) Performed: CESAREAN SECTION WITH BILATERAL TUBAL LIGATION (Bilateral Abdomen)  Patient Location: PACU  Anesthesia Type:Spinal  Level of Consciousness: awake, alert  and oriented  Airway & Oxygen Therapy: Patient Spontanous Breathing  Post-op Assessment: Report given to RN and Post -op Vital signs reviewed and stable  Post vital signs: Reviewed and stable  Last Vitals:  Vitals Value Taken Time  BP 100/48 11/15/2018  4:15 PM  Temp 36.7 C 11/15/2018  4:15 PM  Pulse 87 11/15/2018  4:21 PM  Resp 20 11/15/2018  4:21 PM  SpO2 99 % 11/15/2018  4:21 PM  Vitals shown include unvalidated device data.  Last Pain:  Vitals:   11/15/18 1615  TempSrc: Oral  PainSc: (P) 0-No pain         Complications: No apparent anesthesia complications

## 2018-11-15 NOTE — Op Note (Signed)
Preoperative Diagnosis:  IUP @ [redacted]w[redacted]d, Term PROM, hx of cesarean section x 2, unwanted fertility  Postoperative Diagnosis:  Same  Procedure: Repeat low transverse cesarean section, Bilateral Tubal Ligation with Filshie clips  Surgeon: Tinnie Gens, M.D.  Assistant: Gwenevere Abbot, MD  Anesthesia: spinal with Heather Roberts, MD  Findings: Viable female infant, APGAR (1 MIN): 9   APGAR (5 MINS): 9   APGAR (10 MINS):  NA Wt 3625g (7lb 15.9oz), Normal tubes and ovaries  Estimated blood loss: 371 cc Urine Output: 100 cc IV fluid: 1000 cc  Complications: None known  Specimens: Placenta to labor and delivery  Reason for procedure: Briefly, the patient is a 33 y.o. [id number] at [redacted]w[redacted]d with h/o previous C-section who desires permanent sterility.  Patient counseled, r.e. Risks benefits of BTL, including permanency of procedure, risk of failure(1:100), increased risk of ectopic.  Patient verbalized understanding and desires to proceed   Procedure: Patient is a to the OR where spinal analgesia was administered. She was then placed in a supine position with left lateral tilt. She received 3 g of Ancef, 500mg  azithromycin and SCDs were in place. A Foley catheter was placed in the bladder. She was prepped and draped in the usual sterile fashion. A timeout was performed. A knife was then used to make a Pfannenstiel incision. This incision was carried out to underlying fascia which was divided in the midline with the knife. The incision was extended laterally, sharply. The fascia was dissected of the underlying rectus superiorly.  The rectus was divided in the midline.  The peritoneal cavity was entered bluntly.  Alexis retractor was placed inside the incision.  A knife was used to make a low transverse incision on the uterus. This incision was carried down to the amniotic cavity was entered. Fetus was in cephalic position and was brought up out of the incision without difficulty. Cord was clamped x 2 and cut.  Infant taken to waiting pediatrician.  Cord blood was obtained. Placenta was delivered from the uterus.  Uterus was cleaned with dry lap pads. Uterine incision closed with 0 Vicryl suture in a locked running fashion. Attention was turned to the pt's left tube which was grasped with a Babcock clamp and followed to its fimbriated end.  A Filshie clip was placed across the tube 1.5 cm from the cornu.  Attention was turned to the pt's right tube which was grasped with a Babcock clamp and followed to its fimbriated end.  A Filshie clip was placed across the tube 1.5 cm from the cornu. A 1cc of 0.25% Marcaine was injected into the surrounding tubes bilaterally.  Alexis retractor was removed from the abdomen. Peritoneal closure was done with 0 Vicryl suture.  Fascia is closed with 0 Vicryl suture in a running fashion. Subcutaneous tissue infused with 30cc 0.25% Marcaine.  Skin closed using 4-0 Vicryl on a Keith needle.  Steri strips applied, followed by pressure dressing.  All instrument, needle and lap counts were correct x 2.  Patient was awake and taken to PACU stable.  Infant to Newborn Nursery, stable.   Gwenevere Abbot, MD  Ob Fellow

## 2018-11-15 NOTE — H&P (Signed)
Denise KatayamaBrittany Clay is an 33 y.o. [phone] 2726w6d female.   Chief Complaint: Leaking fluid HPI: Patient reports LOF since early am. Prior C-section x 2. Desires BTL. For RCS with BTL tomorrow. Found to have ROM in MAU. Has no pain right now. Ate Chicken at 6:45 am.  Past Medical History:  Diagnosis Date  . Anemia     Past Surgical History:  Procedure Laterality Date  . CESAREAN SECTION      Family History  Problem Relation Age of Onset  . Cancer Mother   . Cancer Paternal Grandfather    Social History:  reports that she quit smoking about 11 months ago. She has never used smokeless tobacco. She reports previous alcohol use. She reports that she does not use drugs.    Allergies  Allergen Reactions  . Morphine And Related Itching and Swelling    Medications Prior to Admission  Medication Sig Dispense Refill  . acetaminophen (TYLENOL) 500 MG tablet Take 500 mg by mouth every 6 (six) hours as needed for moderate pain.    Marland Kitchen. docusate sodium (COLACE) 100 MG capsule Take 1 capsule (100 mg total) by mouth 2 (two) times daily. (Patient taking differently: Take 100 mg by mouth daily as needed for moderate constipation. ) 10 capsule 0  . Elastic Bandages & Supports (COMFORT FIT MATERNITY SUPP MED) MISC 1 Device by Does not apply route daily. (Patient not taking: Reported on 11/04/2018) 1 each 0  . ferrous sulfate 325 (65 FE) MG tablet Take 1 tablet (325 mg total) by mouth 2 (two) times daily with a meal. 30 tablet 1  . glycopyrrolate (ROBINUL) 2 MG tablet Take 1 tablet (2 mg total) by mouth 3 (three) times daily as needed. (Patient not taking: Reported on 10/26/2018) 30 tablet 3  . ondansetron (ZOFRAN ODT) 4 MG disintegrating tablet Take 1 tablet (4 mg total) by mouth every 6 (six) hours as needed for nausea. (Patient not taking: Reported on 11/09/2018) 20 tablet 0  . ondansetron (ZOFRAN) 4 MG tablet Take 2 tablets (8 mg total) by mouth every 8 (eight) hours as needed for nausea or vomiting. 30  tablet 1  . Prenatal Multivit-Min-Fe-FA (PRENATAL VITAMINS) 0.8 MG tablet Take 1 tablet by mouth daily. 60 tablet 0  . promethazine (PHENERGAN) 25 MG tablet Take 0.5-1 tablets (12.5-25 mg total) by mouth every 6 (six) hours as needed for nausea. (Patient not taking: Reported on 08/10/2018) 30 tablet 2     A comprehensive review of systems was negative.  Blood pressure 113/75, pulse 94, temperature 98.3 F (36.8 C), temperature source Oral, resp. rate 18, height 5\' 4"  (1.626 m), weight 117.8 kg, last menstrual period 02/24/2018, SpO2 100 %. General appearance: alert, cooperative and appears stated age Head: Normocephalic, without obvious abnormality, atraumatic Neck: supple, symmetrical, trachea midline Lungs: normal effort Heart: regular rate and rhythm Abdomen: soft, non-tender; bowel sounds normal; no masses,  no organomegaly Extremities: Homans sign is negative, no sign of DVT Skin: Skin color, texture, turgor normal. No rashes or lesions Neurologic: Grossly normal   Lab Results  Component Value Date   WBC 8.8 10/19/2018   HGB 8.5 (L) 10/19/2018   HCT 27.2 (L) 10/19/2018   MCV 83 10/19/2018   PLT 377 10/19/2018         ABO, Rh: B/Positive/-- (10/01 1442)  Antibody: Negative (10/01 1442)  Rubella: 6.67 (10/01 1442)  RPR: Non Reactive (10/29 0916)  HBsAg: Negative (10/01 1442)  HIV: Non Reactive (10/29 0916)  GBS:  Negative    Assessment/Plan Principal Problem:   PROM (premature rupture of membranes) Active Problems:   Maternal care for scar from previous cesarean delivery  For RCS and BTL. Risks include but are not limited to bleeding, infection, injury to surrounding structures, including bowel, bladder and ureters, blood clots, and death.  Likelihood of success is high. Patient counseled, r.e. Risks benefits of BTL, including permanency of procedure, risk of failure(1:100), increased risk of ectopic.  Patient verbalized understanding and desires to  proceed   Reva Bores 11/15/2018, 11:02 AM

## 2018-11-15 NOTE — Anesthesia Procedure Notes (Addendum)
Spinal  Patient location during procedure: OR Start time: 11/15/2018 2:54 PM End time: 11/15/2018 2:57 PM Staffing Anesthesiologist: Heather Roberts, MD Performed: other anesthesia staff  Preanesthetic Checklist Completed: patient identified, site marked, surgical consent, pre-op evaluation, timeout performed, IV checked, risks and benefits discussed and monitors and equipment checked Spinal Block Patient position: sitting Prep: DuraPrep Patient monitoring: heart rate, continuous pulse ox, blood pressure and cardiac monitor Approach: midline Location: L3-4 Injection technique: single-shot Needle Needle type: Sprotte  Needle gauge: 24 G Needle length: 10 cm Needle insertion depth: 10 cm Assessment Sensory level: T4 Additional Notes Preformed by Miquel Dunn, SRNA

## 2018-11-15 NOTE — MAU Note (Signed)
Woke up? Peed on self twice, fluid is clear. First noted at 0400.Still coming. Pt is scheduled for R c/s tomorrow.  No bleeding. No contractions. (food @ 0630-chicken; fluids 0930- water)

## 2018-11-15 NOTE — MAU Provider Note (Signed)
S: Denise Clay is a 33 y.o. [id number] at [redacted]w[redacted]d  who presents to MAU today complaining of leaking of fluid since 4 am. Reports she got up to go to the bathroom and had a big gush of fluid. Thought she had urinated on herself but noted that the liquid was clear. She had one more episode of gush of fluid this morning. She denies vaginal bleeding. She denies contractions. She reports normal fetal movement. Last ate around 6:30 am and drank fluids at 9:30 am.    O: BP 113/75 (BP Location: Right Arm)   Pulse 94   Temp 98.3 F (36.8 C) (Oral)   Resp 18   Ht 5\' 4"  (1.626 m)   Wt 117.8 kg   LMP 02/24/2018   SpO2 100%   BMI 44.59 kg/m  GENERAL: Well-developed, well-nourished female in no acute distress.  HEAD: Normocephalic, atraumatic.  CHEST: Normal effort of breathing, regular heart rate ABDOMEN: Soft, nontender, gravid PELVIC: Normal external female genitalia. Vagina is pink and rugated. Cervix with normal contour, no lesions. Normal discharge.  + pooling and fluid leaking from cervical os.   Cervical exam:  Dilation: Fingertip Effacement (%): Thick Exam by:: Dr. Earlene Plater   Fetal Monitoring: Baseline: 140 Variability: moderate Accelerations: + Decelerations: none  Contractions: Irritability   Results for orders placed or performed during the hospital encounter of 11/15/18 (from the past 24 hour(s))  Fern Test     Status: None   Collection Time: 11/15/18 10:29 AM  Result Value Ref Range   POCT Fern Test Positive = ruptured amniotic membanes      A: SIUP at [redacted]w[redacted]d  SROM with pooling on exam and +fern test.  Patient with history of 2 prior C-sections.   P: Called L&D provider. Patient will need rLTCS today due to SROM. Was initially scheduled for tomorrow, 1/7. Defer to L&D provider for orders and further management.   Arvilla Market, DO 11/15/2018 10:35 AM

## 2018-11-16 ENCOUNTER — Encounter (HOSPITAL_COMMUNITY): Payer: Self-pay | Admitting: Family Medicine

## 2018-11-16 ENCOUNTER — Inpatient Hospital Stay (HOSPITAL_COMMUNITY)
Admission: RE | Admit: 2018-11-16 | Payer: Medicaid Other | Source: Ambulatory Visit | Admitting: Obstetrics & Gynecology

## 2018-11-16 LAB — CBC
HCT: 29.4 % — ABNORMAL LOW (ref 36.0–46.0)
Hemoglobin: 9.5 g/dL — ABNORMAL LOW (ref 12.0–15.0)
MCH: 26.9 pg (ref 26.0–34.0)
MCHC: 32.3 g/dL (ref 30.0–36.0)
MCV: 83.3 fL (ref 80.0–100.0)
Platelets: 288 10*3/uL (ref 150–400)
RBC: 3.53 MIL/uL — ABNORMAL LOW (ref 3.87–5.11)
RDW: 16.6 % — ABNORMAL HIGH (ref 11.5–15.5)
WBC: 11.9 10*3/uL — ABNORMAL HIGH (ref 4.0–10.5)
nRBC: 0 % (ref 0.0–0.2)

## 2018-11-16 LAB — RPR: RPR Ser Ql: NONREACTIVE

## 2018-11-16 MED ORDER — FERROUS SULFATE 325 (65 FE) MG PO TABS
325.0000 mg | ORAL_TABLET | Freq: Two times a day (BID) | ORAL | Status: DC
  Administered 2018-11-16 – 2018-11-17 (×3): 325 mg via ORAL
  Filled 2018-11-16 (×3): qty 1

## 2018-11-16 NOTE — Lactation Note (Signed)
This note was copied from a baby's chart. Lactation Consultation Note  Patient Name: Denise Clay Today's Date: 11/16/2018 Reason for consult: Initial assessment;Early term 37-38.6wks Breastfeeding consultation services and support information given and reviewed.  This is mom's third baby and she breastfed her previous babies for 3 weeks.  She stopped because she was concerned about her supply.  Mom desires to exclusively breastfeed newborn.  Baby is 20 hours old and latching to the breast with ease.  Mom likes the football hold.  Mom states she knows how to hand express.  Baby is currently sleeping.  Instructed to watch for feeding cues and call for assist prn.  Mom agreeable and will call out when baby ready to feed.  Maternal Data Has patient been taught Hand Expression?: Yes Does the patient have breastfeeding experience prior to this delivery?: Yes  Feeding Feeding Type: Breast Fed  LATCH Score Latch: Repeated attempts needed to sustain latch, nipple held in mouth throughout feeding, stimulation needed to elicit sucking reflex.  Audible Swallowing: A few with stimulation  Type of Nipple: Everted at rest and after stimulation  Comfort (Breast/Nipple): Soft / non-tender  Hold (Positioning): No assistance needed to correctly position infant at breast.  LATCH Score: 8  Interventions Interventions: Breast feeding basics reviewed;Assisted with latch;Breast massage;Adjust position;Support pillows  Lactation Tools Discussed/Used     Consult Status Consult Status: Follow-up Date: 11/17/18 Follow-up type: In-patient    Huston Foley 11/16/2018, 11:35 AM

## 2018-11-16 NOTE — Anesthesia Postprocedure Evaluation (Signed)
Anesthesia Post Note  Patient: Daylene KatayamaBrittany Sanjuan  Procedure(s) Performed: CESAREAN SECTION WITH BILATERAL TUBAL LIGATION (Bilateral Abdomen)     Patient location during evaluation: Mother Baby Anesthesia Type: Spinal Level of consciousness: awake and alert and oriented Pain management: satisfactory to patient Vital Signs Assessment: post-procedure vital signs reviewed and stable Respiratory status: spontaneous breathing and nonlabored ventilation Cardiovascular status: stable Postop Assessment: no headache, no backache, patient able to bend at knees, no signs of nausea or vomiting and adequate PO intake Anesthetic complications: no    Last Vitals:  Vitals:   11/16/18 0100 11/16/18 0500  BP:  120/66  Pulse:  87  Resp: 18 18  Temp: 36.8 C 36.5 C  SpO2: 98% 100%    Last Pain:  Vitals:   11/16/18 0500  TempSrc: Oral  PainSc: 0-No pain   Pain Goal:                 Heiley Shaikh

## 2018-11-16 NOTE — Progress Notes (Signed)
POSTPARTUM PROGRESS NOTE  Post Partum Day 1  Subjective:  Denise Clay is a 33 y.o. [id number] s/p rLTCS and BTL at [redacted]w[redacted]d.  She reports she is doing well. No acute events overnight. She denies any problems with ambulating or po intake. Denies nausea or vomiting.  Pain is well controlled.  Lochia is decreased (3-4 pads overnight). No dizziness when ambulating. Foley catheter removed this morning.   Objective: Blood pressure (!) 101/57, pulse 82, temperature 97.7 F (36.5 C), temperature source Oral, resp. rate 20, height 5\' 4"  (1.626 m), weight 117.8 kg, last menstrual period 02/24/2018, SpO2 100 %, unknown if currently breastfeeding.  Physical Exam:  General: alert, cooperative and no distress Chest: no respiratory distress Heart:regular rate, distal pulses intact Abdomen: soft, nontender,  Uterine Fundus: firm, appropriately tender DVT Evaluation: No calf swelling or tenderness Extremities: No edema Skin: warm, dry Incision: Bandage dry, no surrounding erythema or induration.  Recent Labs    11/15/18 1104 11/16/18 0531  HGB 10.4* 9.5*  HCT 33.2* 29.4*    Assessment/Plan: Denise Clay is a 33 y.o. [id number] s/p rLTCS and BTL at [redacted]w[redacted]d   PPD#1 - Doing well Routine postpartum care Anemia: Hgb 9.5 today, down from 10.4 yesterday. No symptoms of anemia. Started ferrous sulfate 325mg  BID. Contraception: BTL completed Feeding: Breast Dispo: Plan for discharge possibly tomorrow.   LOS: 1 day   [facility] MS3 Marcy Siren, D.O. [place] Fellow  11/16/2018, 12:37 PM

## 2018-11-16 NOTE — Addendum Note (Signed)
Addendum  created 11/16/18 0823 by Shanon Payor, CRNA   Clinical Note Signed

## 2018-11-16 NOTE — Progress Notes (Signed)
Patient voided 150 ml. She is not feeling fullness in her bladder or a need to void more. Bladder scanner revealed 140 ml in bladder. Patient request to drink more fluids. Will continue with IV fluids. Will try to void again in 1 hour.

## 2018-11-16 NOTE — Progress Notes (Signed)
No new drainage noted on dressing with subsequent assessments. Old drainage marked on pressure dressing. Pt denies dizziness, lightheadedness, or weakness. Will continue to monitor.

## 2018-11-17 ENCOUNTER — Encounter (HOSPITAL_COMMUNITY): Payer: Self-pay | Admitting: Advanced Practice Midwife

## 2018-11-17 DIAGNOSIS — Z9851 Tubal ligation status: Secondary | ICD-10-CM

## 2018-11-17 MED ORDER — IBUPROFEN 600 MG PO TABS: 600.0000 mg | ORAL_TABLET | Freq: Four times a day (QID) | ORAL | 0 refills | Status: AC | PRN

## 2018-11-17 MED ORDER — OXYCODONE HCL 5 MG PO TABS
5.0000 mg | ORAL_TABLET | ORAL | Status: DC | PRN
  Administered 2018-11-17: 5 mg via ORAL
  Filled 2018-11-17: qty 1

## 2018-11-17 MED ORDER — OXYCODONE HCL 5 MG PO TABS: 5.0000 mg | ORAL_TABLET | ORAL | 0 refills | Status: DC | PRN

## 2018-11-17 MED ORDER — IBUPROFEN 600 MG PO TABS
600.0000 mg | ORAL_TABLET | Freq: Four times a day (QID) | ORAL | Status: DC | PRN
  Administered 2018-11-17: 600 mg via ORAL

## 2018-11-17 MED ORDER — ACETAMINOPHEN 325 MG PO TABS
650.0000 mg | ORAL_TABLET | Freq: Four times a day (QID) | ORAL | Status: DC | PRN
  Administered 2018-11-17 (×2): 650 mg via ORAL
  Filled 2018-11-17 (×2): qty 2

## 2018-11-17 NOTE — Lactation Note (Signed)
This note was copied from a baby's chart. Lactation Consultation Note  Patient Name: Denise Clay Today's Date: 11/17/2018   P3, Baby 42 hours old.  7.2% weight loss.  Majority of weight loss in the last 24 hours.  3 voids/3 stools in the last 24 hours.  MD request.   With hand expression mother could easily express drops of colostrum.  Main challenge with this infant is encouraging frequency and depth of latch.  Infants has not breastfed since 0100.   Mother likes football hold.  Encouraged her to support her breast and assisted w/ latching in football hold.  Baby is eager but w/ guidance baby maintained latch for more than 25 min with swallows.  Set up DEBP to stimulate her milk supply after every other feeding. Recommend mother post pump 4-6 times per day for 10-20 min with DEBP on initiation setting. Give baby back volume pumped at the next feeding. Reviewed cleaning and milk storage.  Reviewed milk storage and provided mother w/ manual pump. Suggest waking baby and placing STS within 2-3 hours and breastfeed on demand. Feed on demand approximately 8-12 times per day.         Maternal Data    Feeding    LATCH Score                   Interventions    Lactation Tools Discussed/Used     Consult Status      Hardie Pulley 11/17/2018, 10:16 AM

## 2018-11-17 NOTE — Discharge Instructions (Signed)
Postpartum Tubal Ligation, Care After Refer to this sheet in the next few weeks. These instructions provide you with information about caring for yourself after your procedure. Your health care provider may also give you more specific instructions. Your treatment has been planned according to current medical practices, but problems sometimes occur. Call your health care provider if you have any problems or questions after your procedure. What can I expect after the procedure? After the procedure, it is common to have:  A sore throat.  Bruising or pain in your back.  Nausea or vomiting.  Dizziness.  Mild abdominal discomfort or pain, such as cramping, gas pain, or feeling bloated.  Soreness where the incision was made.  Tiredness.  Pain in your shoulders. Follow these instructions at home: Medicines  Take over-the-counter and prescription medicines only as told by your health care provider.  Do not take aspirin because it can cause bleeding.  Do not drive or operate heavy machinery while taking prescription pain medicine. Activity  Rest for the rest of the day.  Gradually return to your normal activities over the next few days.  Do not have sex, douche, or put a tampon or anything else in your vagina for 6 weeks or as long as told by your health care provider.  Do not lift anything that is heavier than your baby for 2 weeks or as long as told by your health care provider. Incision care      Follow instructions from your health care provider about how to take care of your incision. Make sure you: ? Wash your hands with soap and water before you change your bandage (dressing). If soap and water are not available, use hand sanitizer. ? Change your dressing as told by your health care provider. ? Leave stitches (sutures) in place. They may need to stay in place for 2 weeks or longer.  Check your incision area every day for signs of infection. Check for: ? More redness,  swelling, or pain. ? More fluid or blood. ? Warmth. ? Pus or a bad smell. Other Instructions  Do not take baths, swim, or use a hot tub until your health care provider approves. You may take showers.  Keep all follow-up visits as told by your health care provider. This is important. Contact a health care provider if:  You have more redness, swelling, or pain around your incision.  Your incision feels warm to the touch.  You have pus or a bad smell coming from your incision.  The edges of your incision break open after the sutures have been removed.  Your pain does not improve after 2-3 days.  You have a rash.  You repeatedly become dizzy or lightheaded.  Your pain medicine is not helping.  You are constipated. Get help right away if:  You have a fever.  You faint.  You have pain in your abdomen that gets worse.  You have fluid or blood coming from your sutures.  You have shortness of breath or difficulty breathing.  You have chest pain or leg pain.  You have ongoing nausea or diarrhea. This information is not intended to replace advice given to you by your health care provider. Make sure you discuss any questions you have with your health care provider. Document Released: 04/27/2012 Document Revised: 06/23/2017 Document Reviewed: 10/07/2015 Elsevier Interactive Patient Education  2019 [employer] Cesarean Delivery, Care After This sheet gives you information about how to care for yourself after your procedure. Your health care  provider may also give you more specific instructions. If you have problems or questions, contact your health care provider. What can I expect after the procedure? After the procedure, it is common to have:  A small amount of blood or clear fluid coming from the incision.  Some redness, swelling, and pain in your incision area.  Some abdominal pain and soreness.  Vaginal bleeding (lochia). Even though you did not have a vaginal  delivery, you will still have vaginal bleeding and discharge.  Pelvic cramps.  Fatigue. You may have pain, swelling, and discomfort in the tissue between your vagina and your anus (perineum) if:  Your C-section was unplanned, and you were allowed to labor and push.  An incision was made in the area (episiotomy) or the tissue tore during attempted vaginal delivery. Follow these instructions at home: Incision care   Follow instructions from your health care provider about how to take care of your incision. Make sure you: ? Wash your hands with soap and water before you change your bandage (dressing). If soap and water are not available, use hand sanitizer. ? If you have a dressing, change it or remove it as told by your health care provider. ? Leave stitches (sutures), skin staples, skin glue, or adhesive strips in place. These skin closures may need to stay in place for 2 weeks or longer. If adhesive strip edges start to loosen and curl up, you may trim the loose edges. Do not remove adhesive strips completely unless your health care provider tells you to do that.  Check your incision area every day for signs of infection. Check for: ? More redness, swelling, or pain. ? More fluid or blood. ? Warmth. ? Pus or a bad smell.  Do not take baths, swim, or use a hot tub until your health care provider says it's okay. Ask your health care provider if you can take showers.  When you cough or sneeze, hug a pillow. This helps with pain and decreases the chance of your incision opening up (dehiscing). Do this until your incision heals. Medicines  Take over-the-counter and prescription medicines only as told by your health care provider.  If you were prescribed an antibiotic medicine, take it as told by your health care provider. Do not stop taking the antibiotic even if you start to feel better.  Do not drive or use heavy machinery while taking prescription pain medicine. Lifestyle  Do not  drink alcohol. This is especially important if you are breastfeeding or taking pain medicine.  Do not use any products that contain nicotine or tobacco, such as cigarettes, e-cigarettes, and chewing tobacco. If you need help quitting, ask your health care provider. Eating and drinking  Drink at least 8 eight-ounce glasses of water every day unless told not to by your health care provider. If you breastfeed, you may need to drink even more water.  Eat high-fiber foods every day. These foods may help prevent or relieve constipation. High-fiber foods include: ? Whole grain cereals and breads. ? Brown rice. ? Beans. ? Fresh fruits and vegetables. Activity   If possible, have someone help you care for your baby and help with household activities for at least a few days after you leave the hospital.  Return to your normal activities as told by your health care provider. Ask your health care provider what activities are safe for you.  Rest as much as possible. Try to rest or take a nap while your baby is sleeping.  Do not lift anything that is heavier than 10 lbs (4.5 kg), or the limit that you were told, until your health care provider says that it is safe.  Talk with your health care provider about when you can engage in sexual activity. This may depend on your: ? Risk of infection. ? How fast you heal. ? Comfort and desire to engage in sexual activity. General instructions  Do not use tampons or douches until your health care provider approves.  Wear loose, comfortable clothing and a supportive and well-fitting bra.  Keep your perineum clean and dry. Wipe from front to back when you use the toilet.  If you pass a blood clot, save it and call your health care provider to discuss. Do not flush blood clots down the toilet before you get instructions from your health care provider.  Keep all follow-up visits for you and your baby as told by your health care provider. This is  important. Contact a health care provider if:  You have: ? A fever. ? Bad-smelling vaginal discharge. ? Pus or a bad smell coming from your incision. ? Difficulty or pain when urinating. ? A sudden increase or decrease in the frequency of your bowel movements. ? More redness, swelling, or pain around your incision. ? More fluid or blood coming from your incision. ? A rash. ? Nausea. ? Little or no interest in activities you used to enjoy. ? Questions about caring for yourself or your baby.  Your incision feels warm to the touch.  Your breasts turn red or become painful or hard.  You feel unusually sad or worried.  You vomit.  You pass a blood clot from your vagina.  You urinate more than usual.  You are dizzy or light-headed. Get help right away if:  You have: ? Pain that does not go away or get better with medicine. ? Chest pain. ? Difficulty breathing. ? Blurred vision or spots in your vision. ? Thoughts about hurting yourself or your baby. ? New pain in your abdomen or in one of your legs. ? A severe headache.  You faint.  You bleed from your vagina so much that you fill more than one sanitary pad in one hour. Bleeding should not be heavier than your heaviest period. Summary  After the procedure, it is common to have pain at your incision site, abdominal cramping, and slight bleeding from your vagina.  Check your incision area every day for signs of infection.  Tell your health care provider about any unusual symptoms.  Keep all follow-up visits for you and your baby as told by your health care provider. This information is not intended to replace advice given to you by your health care provider. Make sure you discuss any questions you have with your health care provider. Document Released: 07/19/2002 Document Revised: 05/05/2018 Document Reviewed: 05/05/2018 Elsevier Interactive Patient Education  2019 [employer]

## 2018-11-17 NOTE — Discharge Summary (Signed)
Postpartum Discharge Summary     Patient Name: Denise KatayamaBrittany Winkel DOB: 10/05/1986 MRN: [id number]  Date of admission: 11/15/2018 Delivering Provider: Reva BoresPRATT, TANYA S   Date of discharge: 11/17/2018  Admitting diagnosis: 39WKS LEAKING FLUID Intrauterine pregnancy: 485w6d     Secondary diagnosis:  Principal Problem:   Full-term premature rupture of membranes Active Problems:   Anemia, antepartum   Maternal care for scar from previous cesarean delivery   Unwanted fertility   Cesarean delivery delivered   S/P tubal ligation  Additional problems: none     Discharge diagnosis: Term Pregnancy Delivered                                                                                                Post partum procedures:none  Augmentation: none  Complications: None  Hospital course:  Onset of Labor With Unplanned C/S  33 y.o. yo [id number] at 825w6d was admitted in Latent Labor with ruptured membranes (PROM) on 11/15/2018. Patient had a labor course significant for PROM in MAU . Membrane Rupture Time/Date: 4:00 AM ,11/15/2018   The patient went for cesarean section due to Elective Repeat, and delivered a Viable infant,11/15/2018  Details of operation can be found in separate operative note. Patient had an uncomplicated postpartum course.  She is ambulating,tolerating a regular diet, passing flatus, and urinating well.  She had a history of allergy to Morphine but stated she had Percocet with last delivery, so wanted to try it for pain this time. Patient is discharged home in stable condition 11/17/18.  Magnesium Sulfate recieved: No BMZ received: No  Physical exam  Vitals:   11/16/18 0825 11/16/18 1203 11/16/18 2310 11/17/18 0645  BP: (!) 103/57 (!) 101/57 (!) 105/56 111/64  Pulse: 63 82 76 67  Resp: 20 20 18  (!) 22  Temp: 98.2 F (36.8 C) 97.7 F (36.5 C) 97.8 F (36.6 C) 97.7 F (36.5 C)  TempSrc: Oral Oral Oral Oral  SpO2: 98% 100%    Weight:      Height:       General: alert,  cooperative and no distress Lochia: appropriate Uterine Fundus: firm Incision: Dressing is clean, dry, and intact DVT Evaluation: No evidence of DVT seen on physical exam. Labs: Lab Results  Component Value Date   WBC 11.9 (H) 11/16/2018   HGB 9.5 (L) 11/16/2018   HCT 29.4 (L) 11/16/2018   MCV 83.3 11/16/2018   PLT 288 11/16/2018   CMP Latest Ref Rng & Units 11/15/2018  Glucose 70 - 99 mg/dL 81  BUN 6 - 20 mg/dL 8  Creatinine 1.470.44 - 8.291.00 mg/dL 5.620.51  Sodium 130135 - 865145 mmol/L 132(L)  Potassium 3.5 - 5.1 mmol/L 4.0  Chloride 98 - 111 mmol/L 106  CO2 22 - 32 mmol/L 18(L)  Calcium 8.9 - 10.3 mg/dL 7.8(I8.8(L)  Total Protein 6.5 - 8.1 g/dL 6.7  Total Bilirubin 0.3 - 1.2 mg/dL 0.8  Alkaline Phos 38 - 126 U/L 105  AST 15 - 41 U/L 16  ALT 0 - 44 U/L 14    Discharge instruction: per After Visit Summary and "Baby and Me Booklet".  After visit meds:  Allergies as of 11/17/2018      Reactions   Morphine And Related Itching, Swelling      Medication List    TAKE these medications   acetaminophen 500 MG tablet Commonly known as:  TYLENOL Take 500 mg by mouth every 6 (six) hours as needed for moderate pain.   COMFORT FIT MATERNITY SUPP MED Misc 1 Device by Does not apply route daily.   docusate sodium 100 MG capsule Commonly known as:  COLACE Take 1 capsule (100 mg total) by mouth 2 (two) times daily. What changed:    when to take this  reasons to take this   ferrous sulfate 325 (65 FE) MG tablet Take 1 tablet (325 mg total) by mouth 2 (two) times daily with a meal.   ibuprofen 600 MG tablet Commonly known as:  ADVIL,MOTRIN Take 1 tablet (600 mg total) by mouth every 6 (six) hours as needed for moderate pain (severe pain).   oxyCODONE 5 MG immediate release tablet Commonly known as:  Oxy IR/ROXICODONE Take 1 tablet (5 mg total) by mouth every 4 (four) hours as needed for moderate pain.   Prenatal Vitamins 0.8 MG tablet Take 1 tablet by mouth daily.       Diet: routine  diet  Activity: Advance as tolerated. Pelvic rest for 6 weeks.   Outpatient follow up:2 weeks Follow up Appt: Future Appointments  Date Time Provider Department Center  11/30/2018  9:20 AM [facility] NURSE [facility]  12/20/2018  9:35 AM Adam Phenix, MD [facility]   Follow up Visit: Follow-up Information    [facility]. Schedule an appointment as soon as possible for a visit.   Specialty:  Obstetrics and Gynecology Contact information: [address] [place] [address] [phone]           Please schedule this patient for Postpartum visit in: 2 weeks for incision check with the following provider: Any provider For C/S patients schedule nurse incision check in weeks 2 weeks: yes Low risk pregnancy complicated by: none Delivery mode:  CS Anticipated Birth Control:  BTL done with C/S PP Procedures needed: Incision check  Schedule Integrated BH visit: no      Newborn Data: Live born female  Birth Weight: 7 lb 15.9 oz (3625 g) APGAR: 9, 9  Newborn Delivery   Birth date/time:  11/15/2018 15:23:00 Delivery type:  C-Section, Low Transverse Trial of labor:  No C-section categorization:  Repeat     Baby Feeding: Breast Disposition:home with mother   11/17/2018 Wynelle Bourgeois, CNM

## 2018-11-18 ENCOUNTER — Inpatient Hospital Stay (HOSPITAL_COMMUNITY)
Admission: AD | Admit: 2018-11-18 | Discharge: 2018-11-18 | Disposition: A | Discharge status: Home / Self Care | Payer: Medicaid Other | Source: Ambulatory Visit | Attending: Obstetrics and Gynecology | Admitting: Obstetrics and Gynecology

## 2018-11-18 ENCOUNTER — Encounter (HOSPITAL_COMMUNITY): Payer: Self-pay | Admitting: *Deleted

## 2018-11-18 DIAGNOSIS — O9089 Other complications of the puerperium, not elsewhere classified: Secondary | ICD-10-CM | POA: Insufficient documentation

## 2018-11-18 DIAGNOSIS — Z4889 Encounter for other specified surgical aftercare: Secondary | ICD-10-CM

## 2018-11-18 DIAGNOSIS — Z87891 Personal history of nicotine dependence: Secondary | ICD-10-CM | POA: Diagnosis not present

## 2018-11-18 NOTE — MAU Note (Signed)
Pt reports she is s/p c/s on 01/06, reports incision is very painful and she has a lot of bloody discharge. Denies fever.

## 2018-11-18 NOTE — Discharge Instructions (Signed)
Incision Care, Adult °An incision is a surgical cut that is made through your skin. Most incisions are closed after surgery. Your incision may be closed with stitches (sutures), staples, skin glue, or adhesive strips. You may need to return to your health care provider to have sutures or staples removed. This may occur several days to several weeks after your surgery. The incision needs to be cared for properly to prevent infection. °How to care for your incision °Incision care ° °· Follow instructions from your health care provider about how to take care of your incision. Make sure you: °? Wash your hands with soap and water before you change the bandage (dressing). If soap and water are not available, use hand sanitizer. °? Change your dressing as told by your health care provider. °? Leave sutures, skin glue, or adhesive strips in place. These skin closures may need to stay in place for 2 weeks or longer. If adhesive strip edges start to loosen and curl up, you may trim the loose edges. Do not remove adhesive strips completely unless your health care provider tells you to do that. °· Check your incision area every day for signs of infection. Check for: °? More redness, swelling, or pain. °? More fluid or blood. °? Warmth. °? Pus or a bad smell. °· Ask your health care provider how to clean the incision. This may include: °? Using mild soap and water. °? Using a clean towel to pat the incision dry after cleaning it. °? Applying a cream or ointment. Do this only as told by your health care provider. °? Covering the incision with a clean dressing. °· Ask your health care provider when you can leave the incision uncovered. °· Do not take baths, swim, or use a hot tub until your health care provider approves. Ask your health care provider if you can take showers. You may only be allowed to take sponge baths for bathing. °Medicines °· If you were prescribed an antibiotic medicine, cream, or ointment, take or apply the  antibiotic as told by your health care provider. Do not stop taking or applying the antibiotic even if your condition improves. °· Take over-the-counter and prescription medicines only as told by your health care provider. °General instructions °· Limit movement around your incision to improve healing. °? Avoid straining, lifting, or exercise for the first month, or for as long as told by your health care provider. °? Follow instructions from your health care provider about returning to your normal activities. °? Ask your health care provider what activities are safe. °· Protect your incision from the sun when you are outside for the first 6 months, or for as long as told by your health care provider. Apply sunscreen around the scar or cover it up. °· Keep all follow-up visits as told by your health care provider. This is important. °Contact a health care provider if: °· Your have more redness, swelling, or pain around the incision. °· You have more fluid or blood coming from the incision. °· Your incision feels warm to the touch. °· You have pus or a bad smell coming from the incision. °· You have a fever or shaking chills. °· You are nauseous or you vomit. °· You are dizzy. °· Your sutures or staples come undone. °Get help right away if: °· You have a red streak coming from your incision. °· Your incision bleeds through the dressing and the bleeding does not stop with gentle pressure. °· The edges of   your incision open up and separate. °· You have severe pain. °· You have a rash. °· You are confused. °· You faint. °· You have trouble breathing and a fast heartbeat. °This information is not intended to replace advice given to you by your health care provider. Make sure you discuss any questions you have with your health care provider. °Document Released: 05/16/2005 Document Revised: 07/04/2016 Document Reviewed: 05/14/2016 °Elsevier Interactive Patient Education © 2019 Elsevier Inc. ° °

## 2018-11-18 NOTE — MAU Provider Note (Signed)
History     CSN: [id number]  Arrival date and time: 11/18/18 1730   First Provider Initiated Contact with Patient 11/18/18 1908      Chief Complaint  Patient presents with  . Wound Check   HPI Denise Clay is a 33 y.o. G3P3003 postpartum from a c/s on 1/6 who presents for an incision check. She states she started having blood drip from her incision about 3 hours ago. She reports incisional pain but not an abnormal amount. She has not taken any pain medication.   OB History    Gravida  3   Para  3   Term  3   Preterm      AB      Living  3     SAB      TAB      Ectopic      Multiple  0   Live Births  3           Past Medical History:  Diagnosis Date  . Anemia     Past Surgical History:  Procedure Laterality Date  . CESAREAN SECTION    . CESAREAN SECTION Bilateral 11/15/2018   Procedure: CESAREAN SECTION WITH BILATERAL TUBAL LIGATION;  Surgeon: Reva BoresPratt, Tanya S, MD;  [facility];  Service: Obstetrics;  Laterality: Bilateral;    Family History  Problem Relation Age of Onset  . Cancer Mother   . Cancer Paternal Grandmother     Social History   Tobacco Use  . Smoking status: Former Smoker    Last attempt to quit: 12/11/2017    Years since quitting: 0.9  . Smokeless tobacco: Never Used  Substance Use Topics  . Alcohol use: Not Currently    Frequency: Never  . Drug use: Never    Allergies:  Allergies  Allergen Reactions  . Morphine And Related Itching and Swelling    Medications Prior to Admission  Medication Sig Dispense Refill Last Dose  . acetaminophen (TYLENOL) 500 MG tablet Take 500 mg by mouth every 6 (six) hours as needed for moderate pain.   Past Week at Unknown time  . docusate sodium (COLACE) 100 MG capsule Take 1 capsule (100 mg total) by mouth 2 (two) times daily. (Patient taking differently: Take 100 mg by mouth daily as needed for moderate constipation. ) 10 capsule 0 Past Month at Unknown time  . Elastic  Bandages & Supports (COMFORT FIT MATERNITY SUPP MED) MISC 1 Device by Does not apply route daily. (Patient not taking: Reported on 11/04/2018) 1 each 0 Not Taking  . ferrous sulfate 325 (65 FE) MG tablet Take 1 tablet (325 mg total) by mouth 2 (two) times daily with a meal. 30 tablet 1 Past Month at Unknown time  . ibuprofen (ADVIL,MOTRIN) 600 MG tablet Take 1 tablet (600 mg total) by mouth every 6 (six) hours as needed for moderate pain (severe pain). 30 tablet 0   . oxyCODONE (OXY IR/ROXICODONE) 5 MG immediate release tablet Take 1 tablet (5 mg total) by mouth every 4 (four) hours as needed for moderate pain. 20 tablet 0   . Prenatal Multivit-Min-Fe-FA (PRENATAL VITAMINS) 0.8 MG tablet Take 1 tablet by mouth daily. 60 tablet 0 Past Week at Unknown time    Review of Systems  Constitutional: Negative.  Negative for fatigue and fever.  HENT: Negative.   Respiratory: Negative.  Negative for shortness of breath.   Cardiovascular: Negative.  Negative for chest pain.  Gastrointestinal: Negative.  Negative for abdominal pain,  constipation, diarrhea, nausea and vomiting.  Genitourinary: Negative.  Negative for dysuria.  Skin:       Incision drainage  Neurological: Negative.  Negative for dizziness and headaches.   Physical Exam   Blood pressure 130/72, pulse 84, temperature 98.5 F (36.9 C), temperature source Oral, resp. rate 17, height 5\' 4"  (1.626 m), weight 118.4 kg, SpO2 100 %, unknown if currently breastfeeding.  Physical Exam  Nursing note and vitals reviewed. Constitutional: She is oriented to person, place, and time. She appears well-developed and well-nourished. No distress.  HENT:  Head: Normocephalic.  Eyes: Pupils are equal, round, and reactive to light.  Cardiovascular: Normal rate, regular rhythm and normal heart sounds.  Respiratory: Effort normal and breath sounds normal. No respiratory distress.  GI: Soft. Bowel sounds are normal. She exhibits no distension. There is no  abdominal tenderness.  Neurological: She is alert and oriented to person, place, and time.  Skin: Skin is warm and dry.  Psychiatric: She has a normal mood and affect. Her behavior is normal. Judgment and thought content normal.    MAU Course  Procedures  MDM Honeycomb dressing saturated bright red bleeding. Dressing and steri strips removed. Incision clean, dry and intact. No drainage from the incision site. Incision site redressed.  Suspected seroma that drained but incision looks well at this time.  Assessment and Plan   1. Encounter for post surgical wound check    -Discharge home in stable condition -Infection and bleeding precautions discussed -Patient advised to follow-up with OB for postpartum appointment -Patient may return to MAU as needed or if her condition were to change or worsen   Rolm Bookbinder CNM 11/18/2018, 7:08 PM

## 2018-11-29 ENCOUNTER — Ambulatory Visit: Payer: Self-pay

## 2018-11-30 ENCOUNTER — Ambulatory Visit (INDEPENDENT_AMBULATORY_CARE_PROVIDER_SITE_OTHER): Payer: Medicaid Other | Admitting: Obstetrics & Gynecology

## 2018-11-30 VITALS — BP 127/82 | HR 75 | Ht 64.0 in | Wt 257.0 lb

## 2018-11-30 DIAGNOSIS — T148XXA Other injury of unspecified body region, initial encounter: Secondary | ICD-10-CM

## 2018-11-30 DIAGNOSIS — T8149XA Infection following a procedure, other surgical site, initial encounter: Secondary | ICD-10-CM

## 2018-11-30 DIAGNOSIS — Z5189 Encounter for other specified aftercare: Secondary | ICD-10-CM

## 2018-11-30 DIAGNOSIS — L089 Local infection of the skin and subcutaneous tissue, unspecified: Secondary | ICD-10-CM

## 2018-11-30 NOTE — Progress Notes (Signed)
Patient presents to office today for wound check following third c-section on 1/6. Patient went to [facility] on 1/9 for wound check and was diagnosed with a seroma- no intervention at that time. Patient states she looked at her incision yesterday with a mirror and it "just doesn't look right to her". Patient reports discomfort especially with movement. Incision is noted to have 3cm x 3cm fluid collection with small amount of yellow drainage and 4 cm area of granulation tissue noted beside of it on right side of incision. Left side of incision is otherwise well healed & approximated. Dr Marice Potter in to assess incision.  Chase Caller RN BSN 11/30/18

## 2018-11-30 NOTE — Progress Notes (Signed)
   Subjective:    Patient ID: Denise Clay, female    DOB: 1986/06/25, 33 y.o.   MRN: [id number]  HPI 33 yo P3 here for an incision check. She reports a foul smell coming from her incision. She is now 2 weeks post op. She was seen at the [facility] on 11-18-17 for a wound check. She was diagnosed with a seroma.   Review of Systems     Objective:   Physical Exam Breathing, conversing, and ambulating normally Well nourished, well hydrated Black female, no apparent distress Abd- benign, obese Incision- right 1/3 of incision healing abnormally, foul smell noted. I opened the edge and a large amount of serous fluid with foul smell was released. I used tiny scissors to debride dead tissue. I packed the wound with 1 inch gauze. She tolerated the procedure well. In fact, she reported that the area felt much better than when she arrived.      Assessment & Plan:  Wound infection- plan for daily dressing changes by her significant other I will consult home health so that they can teach him how to do this Come back 1 week for wound check

## 2018-12-01 ENCOUNTER — Encounter: Payer: Self-pay | Admitting: General Practice

## 2018-12-01 NOTE — Progress Notes (Unsigned)
[facility] called & stated they have a staff shortage and cannot service the patient's address. Referral placed to [facility], records to be sent by front office staff.

## 2018-12-02 ENCOUNTER — Telehealth: Payer: Self-pay | Admitting: General Practice

## 2018-12-02 ENCOUNTER — Encounter: Payer: Self-pay | Admitting: Family Medicine

## 2018-12-02 ENCOUNTER — Ambulatory Visit (INDEPENDENT_AMBULATORY_CARE_PROVIDER_SITE_OTHER): Payer: Medicaid Other | Admitting: Family Medicine

## 2018-12-02 VITALS — BP 133/82 | HR 83 | Ht 64.0 in | Wt 248.6 lb

## 2018-12-02 DIAGNOSIS — Z4889 Encounter for other specified surgical aftercare: Secondary | ICD-10-CM

## 2018-12-02 DIAGNOSIS — T148XXA Other injury of unspecified body region, initial encounter: Principal | ICD-10-CM

## 2018-12-02 DIAGNOSIS — L089 Local infection of the skin and subcutaneous tissue, unspecified: Secondary | ICD-10-CM

## 2018-12-02 MED ORDER — LEVOFLOXACIN 750 MG PO TABS: 750.0000 mg | ORAL_TABLET | Freq: Every day | ORAL | 0 refills | Status: AC

## 2018-12-02 MED ORDER — METRONIDAZOLE 500 MG PO TABS: 500.0000 mg | ORAL_TABLET | Freq: Two times a day (BID) | ORAL | 0 refills | Status: AC

## 2018-12-02 MED ORDER — OXYCODONE HCL 5 MG PO TABS: 5.0000 mg | ORAL_TABLET | Freq: Four times a day (QID) | ORAL | 0 refills | Status: AC | PRN

## 2018-12-02 NOTE — Telephone Encounter (Signed)
Patient called into front office checking on the status of a nurse coming out to her house for wound care. Called [facility] again and they state they haven't received her records but are short right now and will not have a nurse available. Called [facility] & was told the same thing.  Asked patient if she could come in to the office tomorrow with her boyfriend for wound care instruction. Patient states she couldn't until the afternoon because he works late. Told patient to come in tonight during walk in hours. Patient verbalized understanding & states she will be here. Patient had no questions.

## 2018-12-02 NOTE — Progress Notes (Signed)
   GYNECOLOGY OFFICE VISIT NOTE History:  33 y.o. [id number] here today for follow-up wound infection. She had a C/S on 1/6, uncomplicated postpartum course. Returned to [facility]U 1/9 for incision check - noted blood dripping from incision - honeycomb was soaked through. Presumed to be seroma at that time, no drainage noted and incision site noted to be clean, was redressed. Then followed up in clinic two days ago for another incision check with foul-smelling discharge. U&D performed in clinic - edge opened by provider, large amount of serous fluid with foul smell elicited. Repacked with 1-inch gauze.   Review of Systems:  Pertinent items noted in HPI Review of Systems  Constitutional: Negative for chills and fever.  Gastrointestinal: Negative for diarrhea, nausea and vomiting.  Genitourinary:       Incision pain and burning  Skin: Positive for itching (around incision). Negative for rash.  Neurological: Negative for dizziness and headaches.  Psychiatric/Behavioral: Negative for depression. The patient is not nervous/anxious and does not have insomnia.    Objective:  Physical Exam BP 133/82   Pulse 83   Ht 5\' 4"  (1.626 m)   Wt 248 lb 9.6 oz (112.8 kg)   BMI 42.67 kg/m  Physical Exam  Constitutional: She is oriented to person, place, and time. She appears well-developed and well-nourished. No distress.  HENT:  Head: Normocephalic and atraumatic.  Eyes: Conjunctivae and EOM are normal.  Cardiovascular: Normal rate.  Pulmonary/Chest: No respiratory distress.  Genitourinary:    Genitourinary Comments: See picture  incision about 3cm wide, 2.5 cm deep, no tunneling noted  packing removed and yellow exudate noted with foul smell  surround erythema noted on skin   Neurological: She is alert and oriented to person, place, and time.  Psychiatric: She has a normal mood and affect. Her behavior is normal.  Nursing note and vitals reviewed.    Assessment & Plan:   88BV [id number] who is about 2.5  weeks post-op from repeat C/S for PROM at 38w6. Seen two days ago for post-op wound infection, I&D performed. Here for packing change and wound care instructions for partner. Packing removed without difficulty, incision irrigated with saline. Iodoform 1/2 inch packing replaced, about 6-7 inches. No tunneling noted. Foul-smell noted and some evidence of surrounding cellulitis, so will start  Levaquin and metronidazole for 7-day course. Oxycodone 5mg  #20 also provided for pain relief. Partner and patient instructed on dressing changes every other day. Reviewed return precautions. Family voiced understanding. Has follow-up appointment in about a week.   Cristal Deer. Earlene Plater, DO OB Family Medicine Fellow, [facility] for [employer], [facility]

## 2018-12-13 ENCOUNTER — Ambulatory Visit: Payer: Medicaid Other | Admitting: Obstetrics & Gynecology

## 2018-12-13 ENCOUNTER — Encounter: Payer: Self-pay | Admitting: Obstetrics & Gynecology

## 2018-12-13 VITALS — BP 121/81 | HR 86 | Wt 245.0 lb

## 2018-12-13 DIAGNOSIS — Z5189 Encounter for other specified aftercare: Secondary | ICD-10-CM

## 2018-12-13 NOTE — Progress Notes (Signed)
   Subjective:    Patient ID: Denise Clay, female    DOB: February 19, 1986, 33 y.o.   MRN: [id number]  HPI Denise Clay here for a wound check. I opened a seroma about 2 weeks ago and packed it. Denise Clay has been doing daily dressing changes. She is having no more pain, no problems. She denies any postpartum depression.   Review of Systems She had a BTL at the time of Denise C/S.    Objective:   Physical Exam Breathing, conversing, and ambulating normally Well nourished, well hydrated Black female, no apparent distress Abd- benign Marked healing of the open area at the right side of Denise incision No evidence of infection       Assessment & Plan:  S/P opening of seroma- doing well Continue dressing changes Come back in 1 week for pp visit and incision check

## 2018-12-20 ENCOUNTER — Ambulatory Visit: Payer: Self-pay | Admitting: Obstetrics & Gynecology

## 2018-12-21 ENCOUNTER — Ambulatory Visit: Payer: Self-pay | Admitting: Family Medicine

## 2018-12-27 ENCOUNTER — Ambulatory Visit: Payer: Self-pay | Admitting: Internal Medicine

## 2018-12-29 ENCOUNTER — Encounter: Payer: Self-pay | Admitting: Advanced Practice Midwife

## 2018-12-29 ENCOUNTER — Ambulatory Visit (INDEPENDENT_AMBULATORY_CARE_PROVIDER_SITE_OTHER): Payer: Medicaid Other | Admitting: Advanced Practice Midwife

## 2018-12-29 DIAGNOSIS — T8149XA Infection following a procedure, other surgical site, initial encounter: Secondary | ICD-10-CM

## 2018-12-29 DIAGNOSIS — Z1389 Encounter for screening for other disorder: Secondary | ICD-10-CM | POA: Diagnosis not present

## 2018-12-29 DIAGNOSIS — R51 Headache: Secondary | ICD-10-CM

## 2018-12-29 DIAGNOSIS — R519 Headache, unspecified: Secondary | ICD-10-CM

## 2018-12-29 NOTE — Progress Notes (Signed)
Subjective:     Denise Clay is a 33 y.o. female who presents for a postpartum visit. She is 5 weeks postpartum following a low cervical transverse Cesarean section. I have fully reviewed the prenatal and intrapartum course. The delivery was at 38.6 gestational weeks. Outcome: repeat cesarean section, low transverse incision. Anesthesia: spinal. Postpartum course has been unremarkable. Baby's course has been unremarkable. Baby is feeding by both breast and bottle - good start gentle. Bleeding no bleeding. Bowel function is normal. Bladder function is normal. Patient is not sexually active. Contraception method is tubal ligation. Postpartum depression screening: negative.  The following portions of the patient's history were reviewed and updated as appropriate: allergies, current medications, past family history, past medical history, past social history, past surgical history and problem list.  Review of Systems Pertinent items noted in HPI and remainder of comprehensive ROS otherwise negative.   Objective:    BP 129/83   Pulse 70   Wt 113.4 kg   BMI 42.91 kg/m   VS reviewed, nursing note reviewed,  Constitutional: well developed, well nourished, no distress HEENT: normocephalic CV: normal rate Pulm/chest wall: normal effort Abdomen: soft Neuro: alert and oriented x 3 Skin: warm, dry.  Incision well approximated, no edema, erythema, or exudate Psych: affect normal      Assessment/Plan:   1. Postpartum examination following cesarean delivery --Normal PP exam  2. Generalized headache --Pt with frequent but mild h/a and some blurred vision at times.  Has optometry appt scheduled. --If eye exam wnl but headaches persist, f/u with primary care.  3. Wound infection after surgery --Pt had postop wound infection with packing with home care and in the office. --Incision now well healed, no complications.    Social/emotional concerns:  none.  Contraception: tubal  ligation  Follow up in: 1 year or as needed.   Sharen Counter, CNM 6:01 PM

## 2018-12-29 NOTE — Patient Instructions (Signed)
General Headache Without Cause  A headache is pain or discomfort felt around the head or neck area. The specific cause of a headache may not be found. There are many causes and types of headaches. A few common ones are:   Tension headaches.   Migraine headaches.   Cluster headaches.   Chronic daily headaches.  Follow these instructions at home:  Watch your condition for any changes. Let your health care provider know about them. Take these steps to help with your condition:  Managing pain          Take over-the-counter and prescription medicines only as told by your health care provider.   Lie down in a dark, quiet room when you have a headache.   If directed, put ice on your head and neck area:  ? Put ice in a plastic bag.  ? Place a towel between your skin and the bag.  ? Leave the ice on for 20 minutes, 2-3 times per day.   If directed, apply heat to the affected area. Use the heat source that your health care provider recommends, such as a moist heat pack or a heating pad.  ? Place a towel between your skin and the heat source.  ? Leave the heat on for 20-30 minutes.  ? Remove the heat if your skin turns bright red. This is especially important if you are unable to feel pain, heat, or cold. You may have a greater risk of getting burned.   Keep lights dim if bright lights bother you or make your headaches worse.  Eating and drinking   Eat meals on a regular schedule.   If you drink alcohol:  ? Limit how much you use to:   0-1 drink a day for women.   0-2 drinks a day for men.  ? Be aware of how much alcohol is in your drink. In the [place] one drink equals one 12 oz bottle of beer (355 mL), one 5 oz glass of wine (148 mL), or one 1 oz glass of hard liquor (44 mL).   Stop drinking caffeine, or decrease the amount of caffeine you drink.  General instructions     Keep a headache journal to help find out what may trigger your headaches. For example, write down:  ? What you eat and drink.  ? How much  sleep you get.  ? Any change to your diet or medicines.   Try massage or other relaxation techniques.   Limit stress.   Sit up straight, and do not tense your muscles.   Do not use any products that contain nicotine or tobacco, such as cigarettes, e-cigarettes, and chewing tobacco. If you need help quitting, ask your health care provider.   Exercise regularly as told by your health care provider.   Sleep on a regular schedule. Get 7-9 hours of sleep each night, or the amount recommended by your health care provider.   Keep all follow-up visits as told by your health care provider. This is important.  Contact a health care provider if:   Your symptoms are not helped by medicine.   You have a headache that is different from the usual headache.   You have nausea or you vomit.   You have a fever.  Get help right away if:   Your headache becomes severe quickly.   Your headache gets worse after moderate to intense physical activity.   You have repeated vomiting.   You have a stiff neck.     You have a loss of vision.   You have problems with speech.   You have pain in the eye or ear.   You have muscular weakness or loss of muscle control.   You lose your balance or have trouble walking.   You feel faint or pass out.   You have confusion.   You have a seizure.  Summary   A headache is pain or discomfort felt around the head or neck area.   There are many causes and types of headaches. In some cases, the cause may not be found.   Keep a headache journal to help find out what may trigger your headaches. Watch your condition for any changes. Let your health care provider know about them.   Contact a health care provider if you have a headache that is different from the usual headache, or if your symptoms are not helped by medicine.   Get help right away if your headache becomes severe, you vomit, you have a loss of vision, you lose your balance, or you have a seizure.  This information is not  intended to replace advice given to you by your health care provider. Make sure you discuss any questions you have with your health care provider.  Document Released: 10/27/2005 Document Revised: 05/17/2018 Document Reviewed: 05/17/2018  Elsevier Interactive Patient Education  2019 [employer] Inc[employer]

## 2019-05-17 DIAGNOSIS — Z20828 Contact with and (suspected) exposure to other viral communicable diseases: Secondary | ICD-10-CM | POA: Diagnosis not present

## 2019-12-28 DIAGNOSIS — Z20828 Contact with and (suspected) exposure to other viral communicable diseases: Secondary | ICD-10-CM | POA: Diagnosis not present

## 2019-12-28 DIAGNOSIS — Z7189 Other specified counseling: Secondary | ICD-10-CM | POA: Diagnosis not present

## 2019-12-28 DIAGNOSIS — R519 Headache, unspecified: Secondary | ICD-10-CM | POA: Diagnosis not present

## 2019-12-28 DIAGNOSIS — R0981 Nasal congestion: Secondary | ICD-10-CM | POA: Diagnosis not present

## 2019-12-28 IMAGING — US US OB < 14 WKS - US OB TV - US DOPPLER
1 of 2 series · 13 of 28 positions shown · non-contrast
Comparison: None.

CLINICAL DATA: Pelvic pain with vomiting, i-STAT HCG greater than
6555



[Series 1: us ob < 14 wks - us ob tv - us doppler · 13 of 72 slices shown]
[im 3/72]
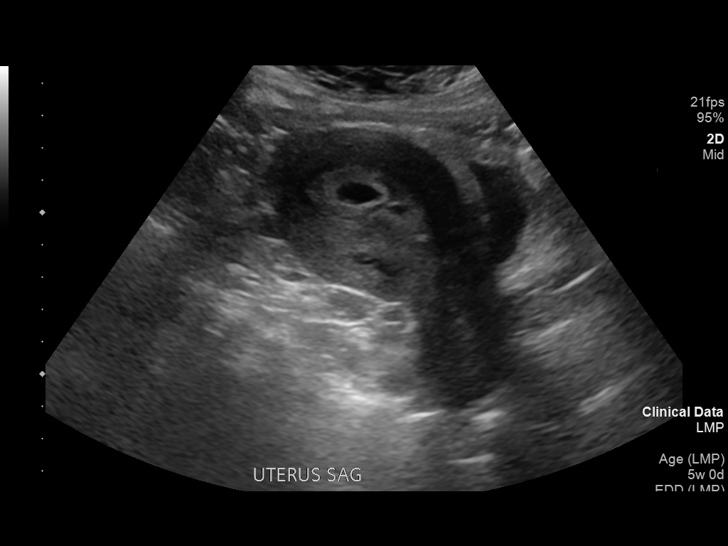
[im 9/72]
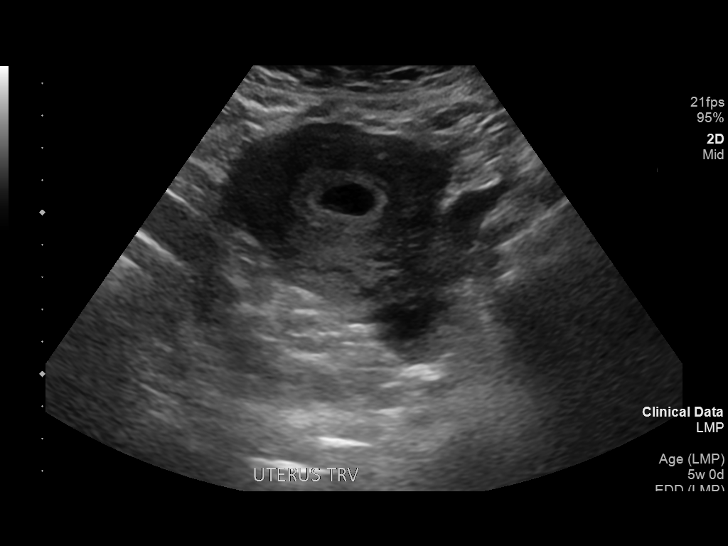
[im 14/72]
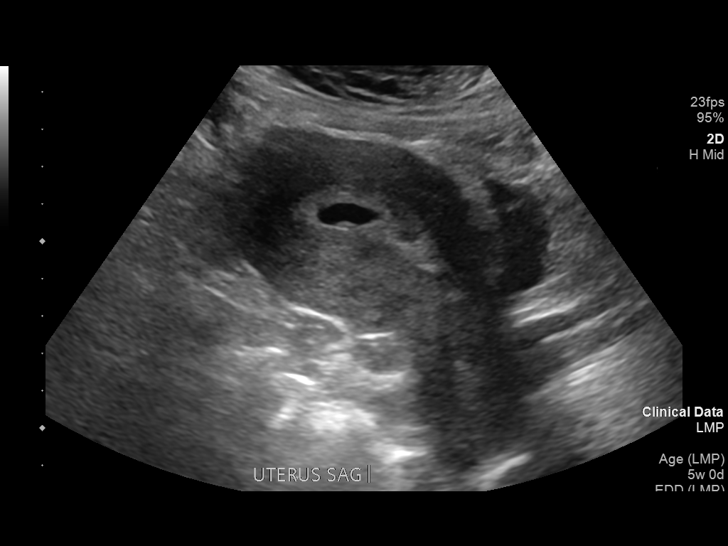
[im 20/72]
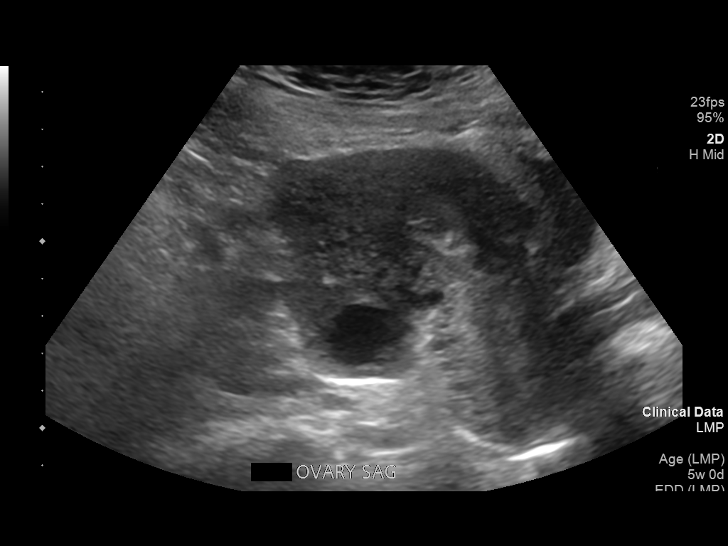
[im 25/72]
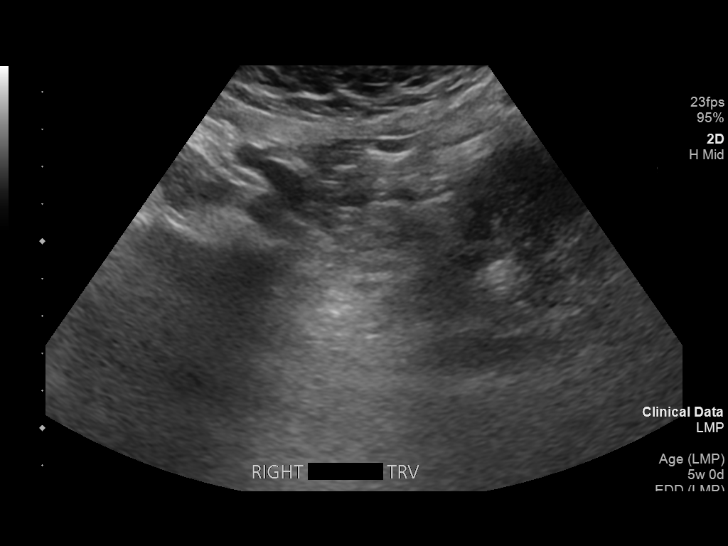
[im 31/72]
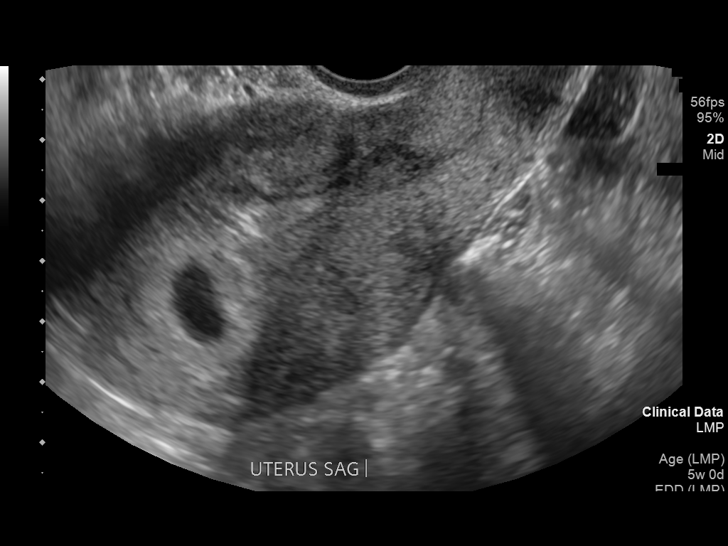
[im 39/72]
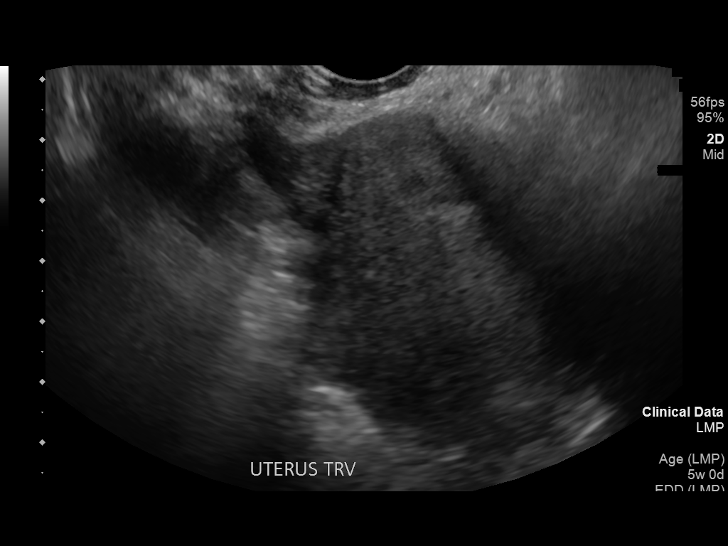
[im 44/72]
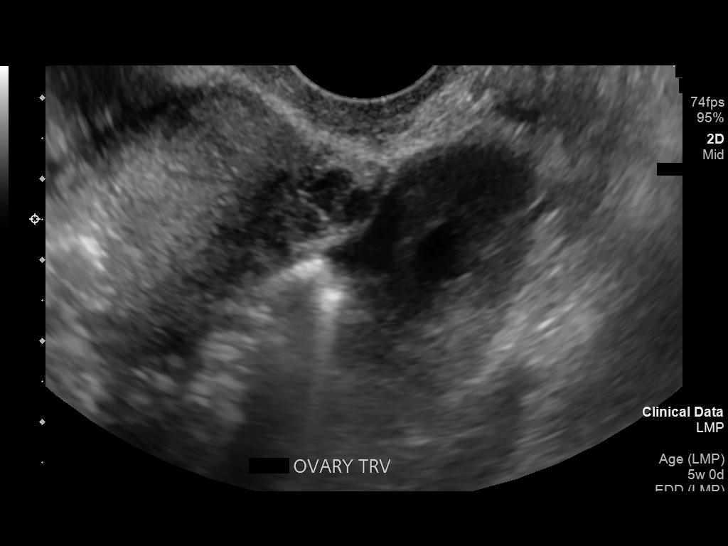
[im 50/72]
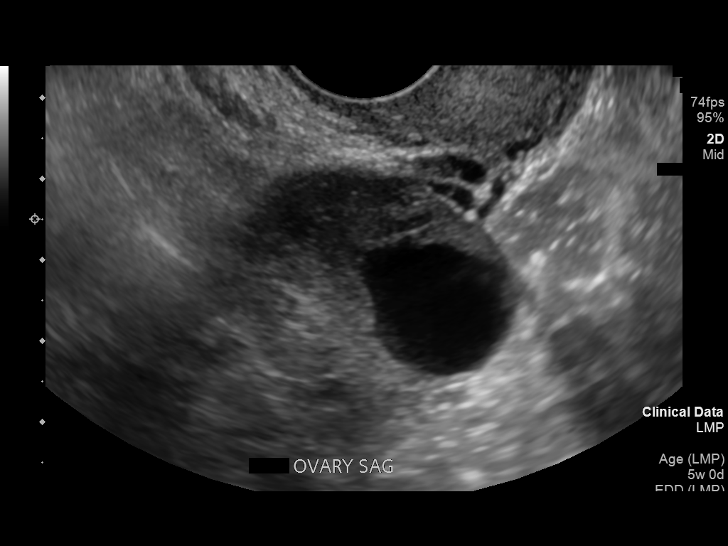
[im 55/72]
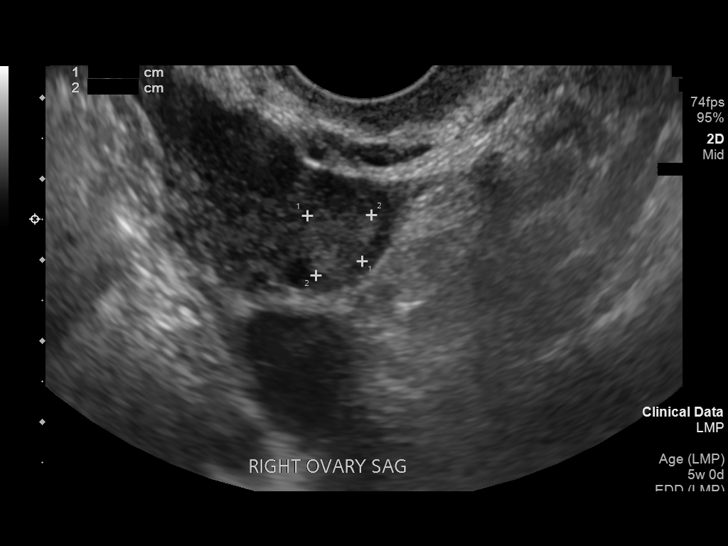
[im 61/72]
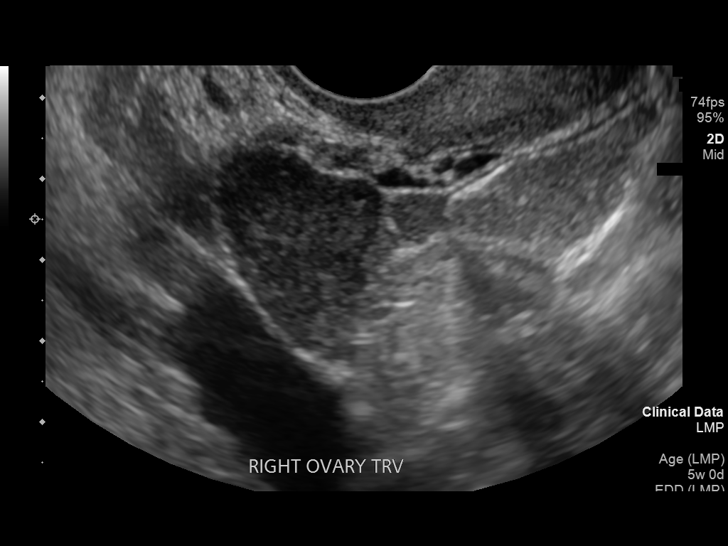
[im 66/72]
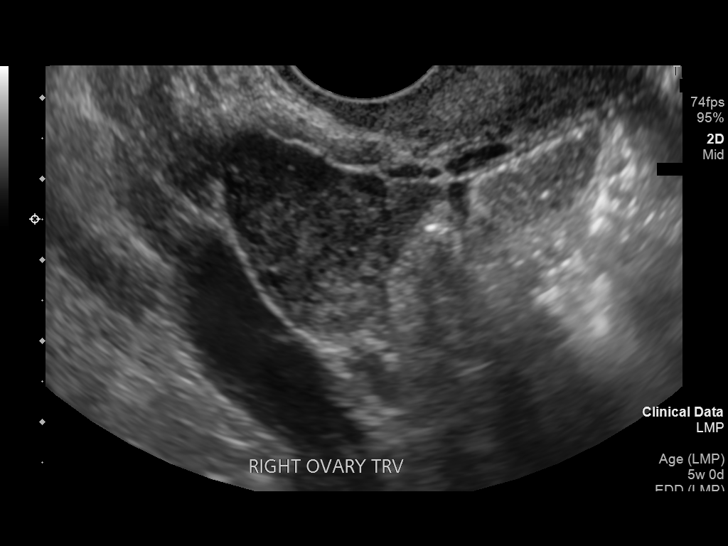
[im 72/72]
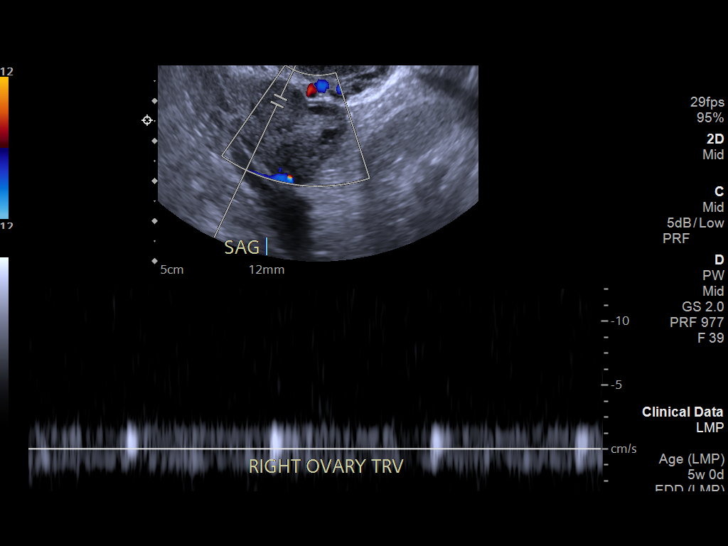

[13 of 28 positions shown; findings below may reference images not displayed]

FINDINGS: Intrauterine gestational sac: Single intrauterine gestational sac.

Yolk sac:  Visible

Embryo:  Not visible

MSD: 13.3 mm   6 w   1 d

Subchorionic hemorrhage:  None visualized.

Maternal uterus/adnexae: Left ovary is within normal limits and
measures 2.7 by 3.5 x 2.1 cm. Cyst measuring 1.9 cm. The right ovary
measures 3.3 x 2.2 by 2 cm. Echogenic lesion in the right ovary
measuring 1 cm, possible hemorrhagic follicle. Trace free fluid.

Pulsed Doppler evaluation of both ovaries demonstrates normal
appearing low-resistance arterial and venous waveforms.
IMPRESSION: 1. Negative for ovarian torsion
2. Single intrauterine pregnancy with visible gestational sac and
yolk sac but no embryo. Consider follow-up ultrasound in 10-14 days
to confirm viability.
3. Trace free fluid in the pelvis

## 2020-02-08 DIAGNOSIS — H16223 Keratoconjunctivitis sicca, not specified as Sjogren's, bilateral: Secondary | ICD-10-CM | POA: Diagnosis not present

## 2020-02-08 DIAGNOSIS — H40033 Anatomical narrow angle, bilateral: Secondary | ICD-10-CM | POA: Diagnosis not present

## 2020-03-25 DIAGNOSIS — H5213 Myopia, bilateral: Secondary | ICD-10-CM | POA: Diagnosis not present

## 2020-07-11 DIAGNOSIS — R110 Nausea: Secondary | ICD-10-CM | POA: Diagnosis not present

## 2020-07-11 DIAGNOSIS — Z20822 Contact with and (suspected) exposure to covid-19: Secondary | ICD-10-CM | POA: Diagnosis not present

## 2020-07-11 DIAGNOSIS — R519 Headache, unspecified: Secondary | ICD-10-CM | POA: Diagnosis not present

## 2020-08-03 IMAGING — US US MFM OB FOLLOW-UP
1 series · 13 of 28 positions shown · non-contrast
Comparison: none

[Series 1: us mfm ob follow-up · 50 acquisitions, 13 frames shown]
[im 2/50]
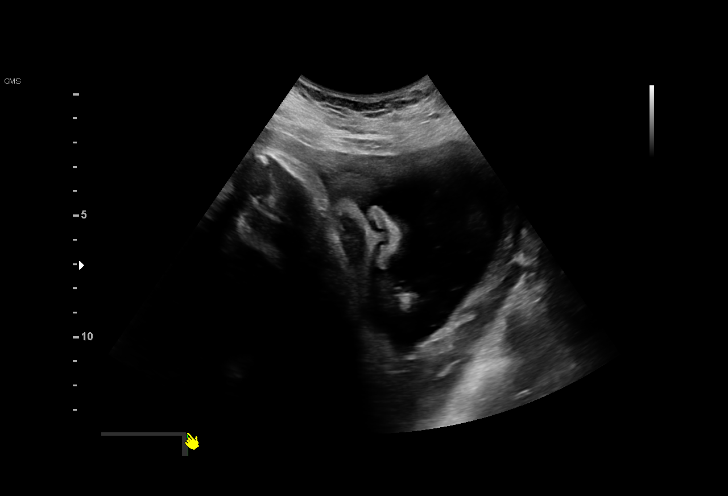
[im 6/50]
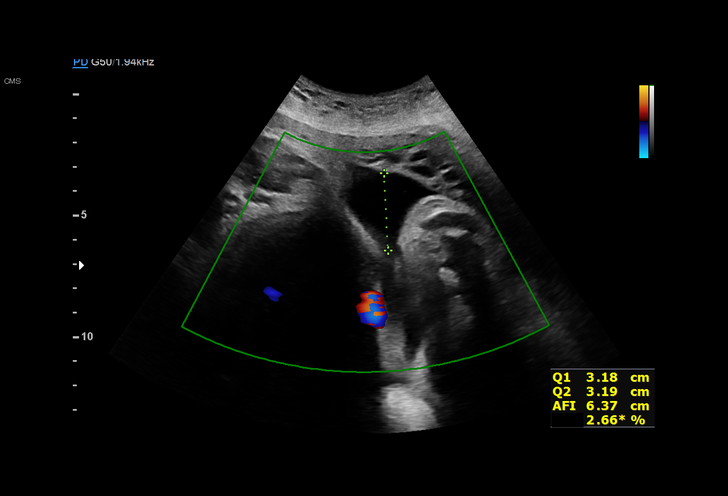
[im 10/50]
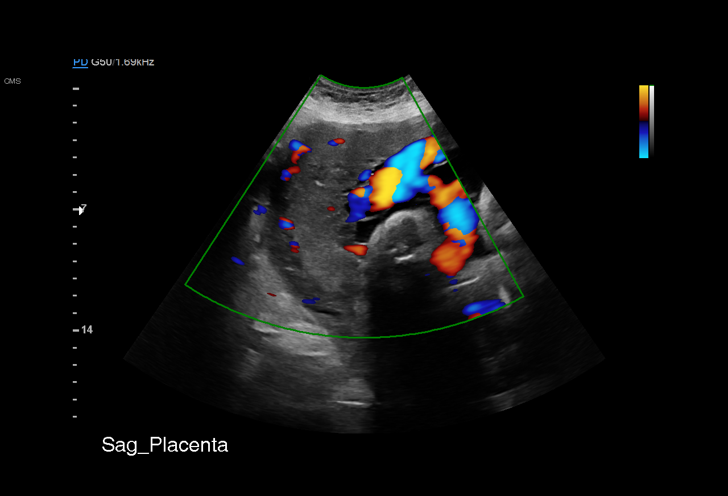
[im 13/50]
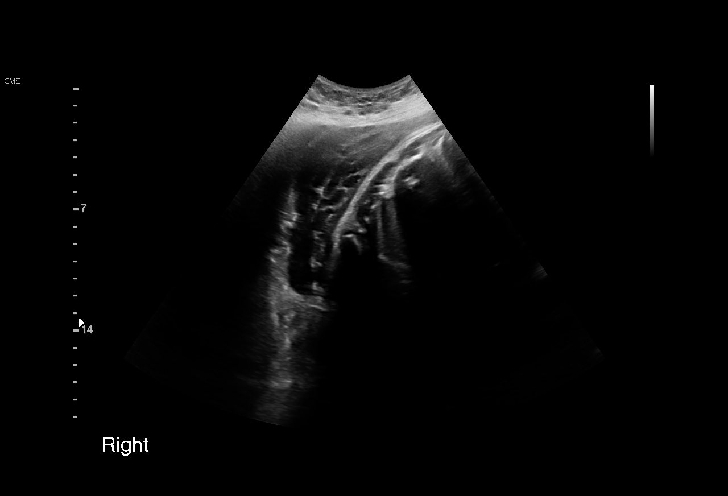
[im 17/50]
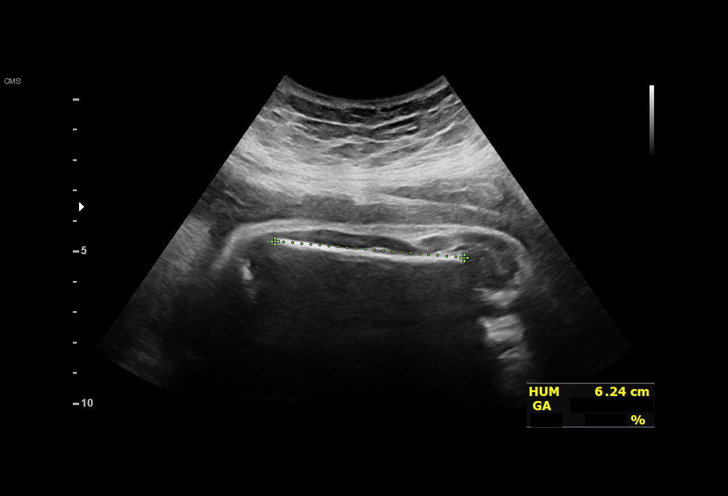
[im 20/50]
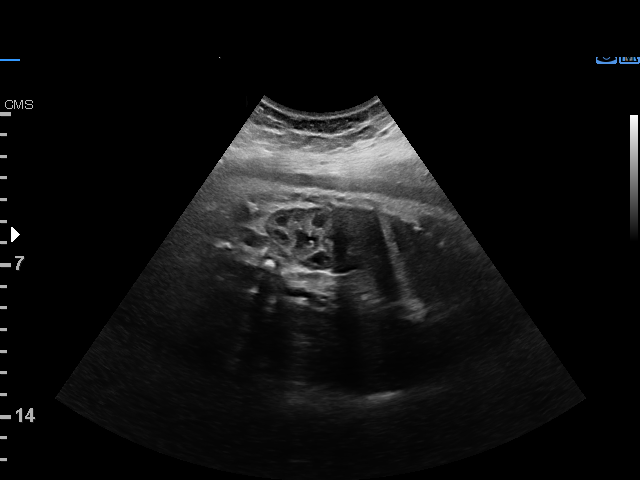
[im 26/50]
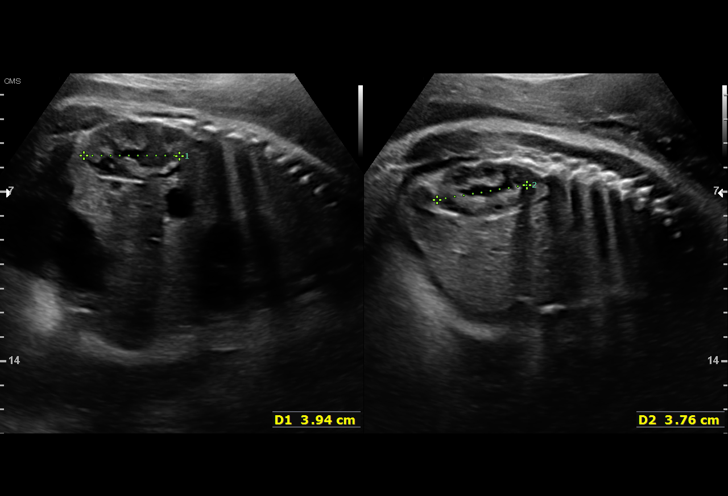
[im 30/50]
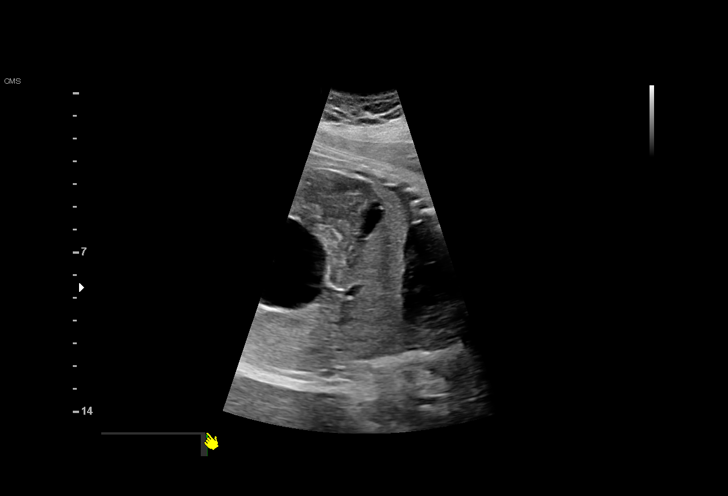
[im 33/50]
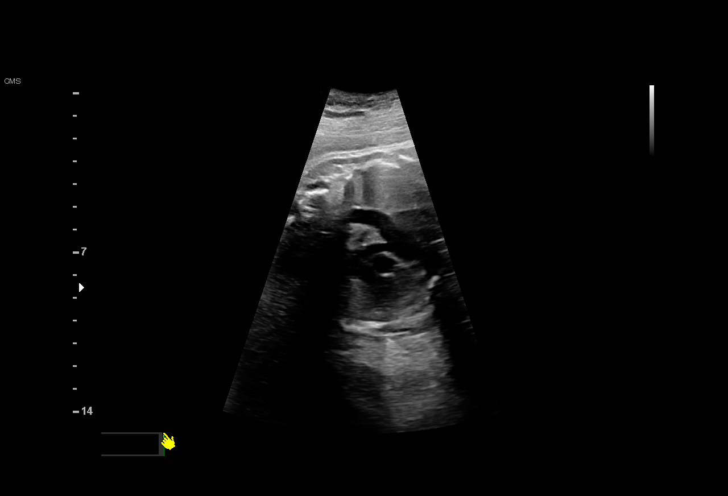
[im 37/50]
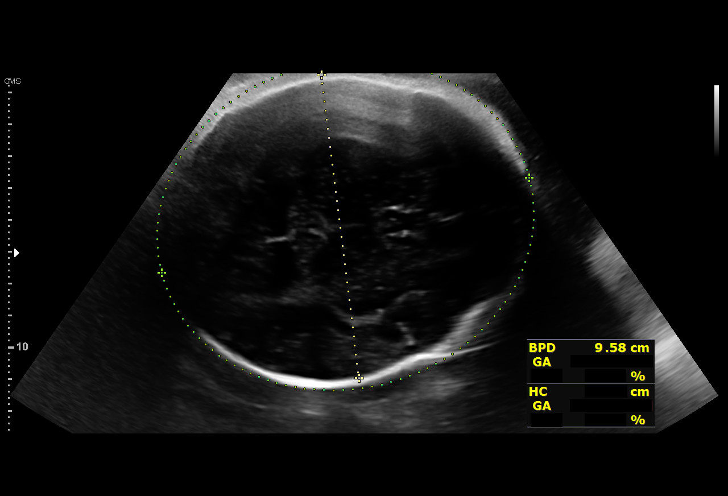
[im 40/50]
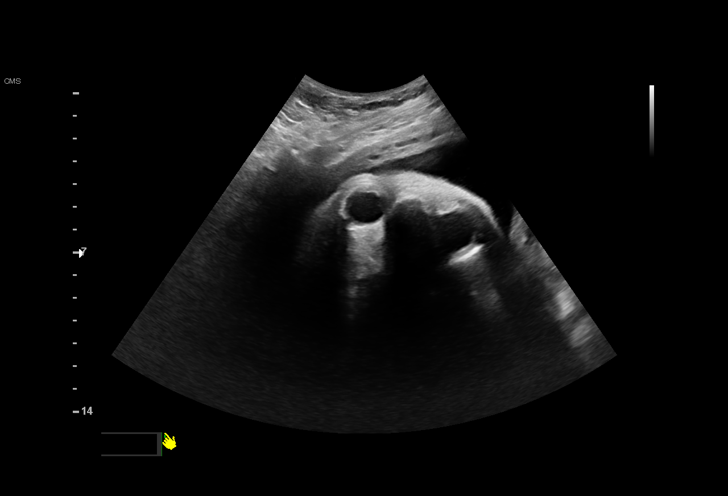
[im 44/50]
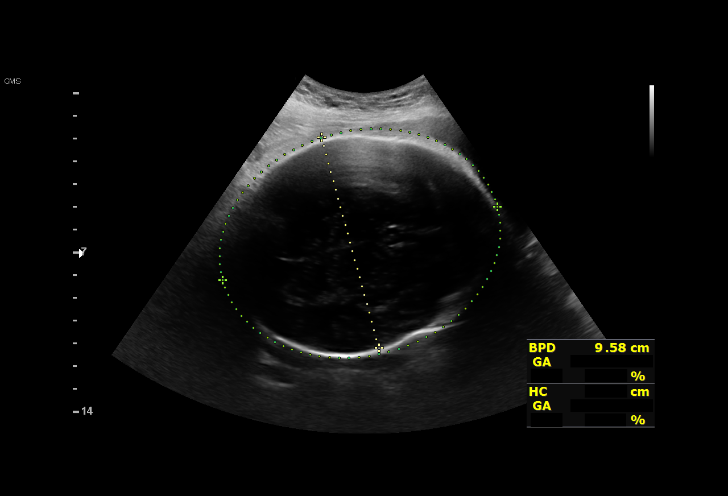
[im 48/50]
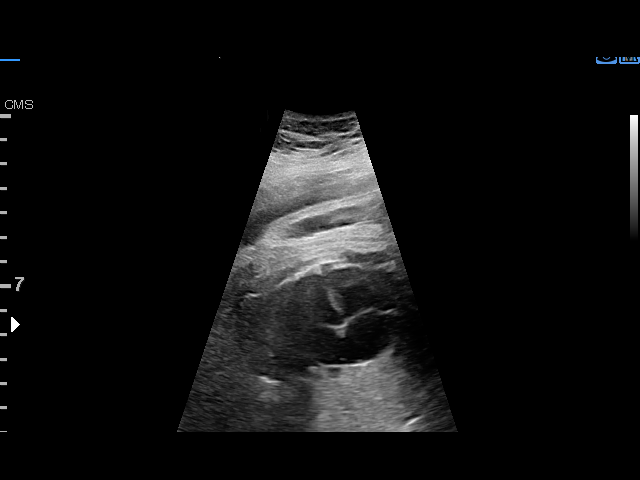

[13 of 28 positions shown; findings below may reference images not displayed]

----------------------------------------------------------------------

 ----------------------------------------------------------------------
Indications

  37 weeks gestation of pregnancy
  Uterine size-date discrepancy, third trimester
  (S>D) (42 cm at 37 weeks)
 ----------------------------------------------------------------------
Vital Signs

                                                Height:        5'4"
Fetal Evaluation

 Num Of Fetuses:         1
 Fetal Heart Rate(bpm):  152
 Cardiac Activity:       Observed
 Presentation:           Cephalic
 Placenta:               Posterior
 P. Cord Insertion:      Visualized

 Amniotic Fluid
 AFI FV:      Within normal limits

 AFI Sum(cm)     %Tile       Largest Pocket(cm)
 14.39           54

 RUQ(cm)       RLQ(cm)       LUQ(cm)        LLQ(cm)

Biometry

 BPD:      95.8  mm     G. Age:  39w 1d         96  %    CI:        74.06   %    70 - 86
                                                         FL/HC:      19.5   %    20.8 -
 HC:      353.5  mm     G. Age:  41w 2d         96  %    HC/AC:      0.96        0.92 -
 AC:      368.6  mm     G. Age:  40w 5d       > 97  %    FL/BPD:     71.8   %    71 - 87
 FL:       68.8  mm     G. Age:  35w 2d          8  %    FL/AC:      18.7   %    20 - 24
 HUM:      63.8  mm     G. Age:  37w 0d         65  %

 Est. FW:    9866  gm      8 lb 7 oz   > 90  %
OB History

 Gravidity:    3         Term:   2
 Living:       2
Gestational Age

 LMP:           36w 2d        Date:  02/24/18                 EDD:   12/01/18
 U/S Today:     39w 1d                                        EDD:   11/11/18
 Best:          37w 3d     Det. By:  Previous Ultrasound      EDD:   11/23/18
                                     (04/13/18)
Anatomy

 Cranium:               Appears normal         Aortic Arch:            Appears normal
 Cavum:                 Appears normal         Ductal Arch:            Not well visualized
 Ventricles:            Appears normal         Diaphragm:              Appears normal
 Choroid Plexus:        Previously seen        Stomach:                Appears normal, left
                                                                       sided
 Cerebellum:            Previously seen        Abdomen:                Appears normal
 Posterior Fossa:       Previously seen        Abdominal Wall:         Previously seen
 Nuchal Fold:           Not applicable (>20    Cord Vessels:           Appears normal (3
                        wks GA)                                        vessel cord)
 Face:                  Appears normal         Kidneys:                Appear normal
                        (orbits and profile)
 Lips:                  Appears normal         Bladder:                Appears normal
 Thoracic:              Appears normal         Spine:                  Previously seen
 Heart:                 Previously seen        Upper Extremities:      Previously seen
 RVOT:                  Appears normal         Lower Extremities:      Previously seen
 LVOT:                  Appears normal

 Other:  5th digit previously visualized. Nasal bone visualized. Open hands
         previously visualized.
Cervix Uterus Adnexa

 Cervix
 Not visualized (advanced GA >16wks)

 Uterus
 No abnormality visualized.

 Left Ovary
 Not visualized.

 Right Ovary
 Not visualized.

 Cul De Sac
 No free fluid seen.

 Adnexa
 No abnormality visualized.
Comments

 U/S images reviewed. Findings reviewed with patient.
 Accelerated fetal growth is seen (>90%.  No fetal
 abnormalities are identified.
 Questions answered.
 10 minutes spent face to face with patient.
 Recommendations: 1) Serial U/S every 4 weeks for fetal
 growth (no further appointments made)
Recommendations

 1) Serial U/S every 4 weeks for fetal growth (no further
 appointments made)

              Livet, Thaya

## 2020-08-24 DIAGNOSIS — Z1152 Encounter for screening for COVID-19: Secondary | ICD-10-CM | POA: Diagnosis not present

## 2020-08-24 DIAGNOSIS — R42 Dizziness and giddiness: Secondary | ICD-10-CM | POA: Diagnosis not present

## 2020-08-24 DIAGNOSIS — B349 Viral infection, unspecified: Secondary | ICD-10-CM | POA: Diagnosis not present

## 2020-08-28 DIAGNOSIS — Z1152 Encounter for screening for COVID-19: Secondary | ICD-10-CM | POA: Diagnosis not present

## 2020-09-17 DIAGNOSIS — K529 Noninfective gastroenteritis and colitis, unspecified: Secondary | ICD-10-CM | POA: Diagnosis not present

## 2020-11-08 DIAGNOSIS — R5381 Other malaise: Secondary | ICD-10-CM | POA: Diagnosis not present

## 2020-11-08 DIAGNOSIS — U071 COVID-19: Secondary | ICD-10-CM | POA: Diagnosis not present

## 2021-10-01 DIAGNOSIS — R7611 Nonspecific reaction to tuberculin skin test without active tuberculosis: Secondary | ICD-10-CM | POA: Diagnosis not present

## 2022-01-20 DIAGNOSIS — J020 Streptococcal pharyngitis: Secondary | ICD-10-CM | POA: Diagnosis not present

## 2023-06-08 DIAGNOSIS — Z3202 Encounter for pregnancy test, result negative: Secondary | ICD-10-CM | POA: Diagnosis not present

## 2023-06-08 DIAGNOSIS — J020 Streptococcal pharyngitis: Secondary | ICD-10-CM | POA: Diagnosis not present

## 2023-06-08 DIAGNOSIS — M791 Myalgia, unspecified site: Secondary | ICD-10-CM | POA: Diagnosis not present

## 2023-06-08 DIAGNOSIS — R059 Cough, unspecified: Secondary | ICD-10-CM | POA: Diagnosis not present

## 2023-06-08 DIAGNOSIS — Z1152 Encounter for screening for COVID-19: Secondary | ICD-10-CM | POA: Diagnosis not present

## 2023-06-08 DIAGNOSIS — H9201 Otalgia, right ear: Secondary | ICD-10-CM | POA: Diagnosis not present

## 2023-06-08 DIAGNOSIS — J101 Influenza due to other identified influenza virus with other respiratory manifestations: Secondary | ICD-10-CM | POA: Diagnosis not present

## 2023-06-08 DIAGNOSIS — R509 Fever, unspecified: Secondary | ICD-10-CM | POA: Diagnosis not present

## 2023-06-09 DIAGNOSIS — J020 Streptococcal pharyngitis: Secondary | ICD-10-CM | POA: Diagnosis not present

## 2023-09-02 ENCOUNTER — Other Ambulatory Visit: Payer: Self-pay | Admitting: *Deleted

## 2023-09-02 ENCOUNTER — Ambulatory Visit: Payer: Self-pay | Admitting: *Deleted

## 2023-09-02 NOTE — Patient Outreach (Signed)
Care Coordination  09/02/2023  Daylene Katayama 1985/12/16 [id number]  Successful telephone outreach with Ms. Coy. Ms. Poitevint does not have a PCP and has moved to [facility], [place]. RNCM updated patient's address information in EMR and verified with Ms. Plagge that her insurance plan had updated address information. RNCM advised patient to establish care with a PCP in her area and update [facility] with provider information. Explained to Ms. Katayama that she would need to request case management with [employer] or PCP to receive services that will best meet her needs. Ms. Ropp voiced understanding.  Estanislado Emms RN, BSN [place]  [facility] RN Care Coordinator [phone]

## 2023-09-10 ENCOUNTER — Ambulatory Visit: Payer: Self-pay | Admitting: *Deleted

## 2023-12-29 DIAGNOSIS — S0990XA Unspecified injury of head, initial encounter: Secondary | ICD-10-CM | POA: Diagnosis not present

## 2023-12-29 DIAGNOSIS — I10 Essential (primary) hypertension: Secondary | ICD-10-CM | POA: Diagnosis not present

## 2023-12-29 DIAGNOSIS — M1712 Unilateral primary osteoarthritis, left knee: Secondary | ICD-10-CM | POA: Diagnosis not present

## 2023-12-29 DIAGNOSIS — S20219A Contusion of unspecified front wall of thorax, initial encounter: Secondary | ICD-10-CM | POA: Diagnosis not present

## 2023-12-29 DIAGNOSIS — S8992XA Unspecified injury of left lower leg, initial encounter: Secondary | ICD-10-CM | POA: Diagnosis not present

## 2023-12-29 DIAGNOSIS — S161XXA Strain of muscle, fascia and tendon at neck level, initial encounter: Secondary | ICD-10-CM | POA: Diagnosis not present

## 2023-12-29 DIAGNOSIS — R000 Tachycardia, unspecified: Secondary | ICD-10-CM | POA: Diagnosis not present
